# Patient Record
Sex: Male | Born: 1940 | Race: White | Hispanic: No | Marital: Married | Smoking: Never smoker
Health system: Southern US, Community
[De-identification: ages and names within clinical notes are randomized; demographics above are authoritative.]

## PROBLEM LIST (undated history)

## (undated) ENCOUNTER — Emergency Department: Admission: EM | Discharge status: Absent without leave | Payer: 59

## (undated) DIAGNOSIS — E079 Disorder of thyroid, unspecified: Secondary | ICD-10-CM

## (undated) DIAGNOSIS — G20A1 Parkinson's disease without dyskinesia, without mention of fluctuations: Secondary | ICD-10-CM

## (undated) DIAGNOSIS — I2510 Atherosclerotic heart disease of native coronary artery without angina pectoris: Secondary | ICD-10-CM

## (undated) DIAGNOSIS — R251 Tremor, unspecified: Secondary | ICD-10-CM

## (undated) DIAGNOSIS — E039 Hypothyroidism, unspecified: Secondary | ICD-10-CM

## (undated) DIAGNOSIS — G510 Bell's palsy: Secondary | ICD-10-CM

## (undated) DIAGNOSIS — I10 Essential (primary) hypertension: Secondary | ICD-10-CM

## (undated) DIAGNOSIS — E785 Hyperlipidemia, unspecified: Secondary | ICD-10-CM

## (undated) DIAGNOSIS — G20 Parkinson's disease: Secondary | ICD-10-CM

---

## 2000-07-17 HISTORY — PX: CAROTID STENT: SHX1301

## 2000-07-17 HISTORY — PX: CARDIAC CATHETERIZATION: SHX172

## 2014-08-12 ENCOUNTER — Observation Stay: Payer: Self-pay | Admitting: Surgery

## 2014-08-12 LAB — URINALYSIS, COMPLETE
Bacteria: NONE SEEN
Bilirubin,UR: NEGATIVE
Blood: NEGATIVE
Glucose,UR: 50 mg/dL (ref 0–75)
KETONE: NEGATIVE
LEUKOCYTE ESTERASE: NEGATIVE
Nitrite: NEGATIVE
PH: 7 (ref 4.5–8.0)
PROTEIN: NEGATIVE
Specific Gravity: 1.035 (ref 1.003–1.030)
Squamous Epithelial: NONE SEEN
WBC UR: 3 /HPF (ref 0–5)

## 2014-08-12 LAB — CBC WITH DIFFERENTIAL/PLATELET
BASOS ABS: 0.1 10*3/uL (ref 0.0–0.1)
BASOS PCT: 0.8 %
EOS ABS: 0 10*3/uL (ref 0.0–0.7)
EOS PCT: 0.4 %
HCT: 50.9 % (ref 40.0–52.0)
HGB: 16.7 g/dL (ref 13.0–18.0)
Lymphocyte #: 1.7 10*3/uL (ref 1.0–3.6)
Lymphocyte %: 20.6 %
MCH: 27.1 pg (ref 26.0–34.0)
MCHC: 32.7 g/dL (ref 32.0–36.0)
MCV: 83 fL (ref 80–100)
MONOS PCT: 10.1 %
Monocyte #: 0.8 x10 3/mm (ref 0.2–1.0)
NEUTROS ABS: 5.7 10*3/uL (ref 1.4–6.5)
Neutrophil %: 68.1 %
PLATELETS: 236 10*3/uL (ref 150–440)
RBC: 6.15 10*6/uL — AB (ref 4.40–5.90)
RDW: 13.5 % (ref 11.5–14.5)
WBC: 8.4 10*3/uL (ref 3.8–10.6)

## 2014-08-12 LAB — COMPREHENSIVE METABOLIC PANEL
ALBUMIN: 3.9 g/dL (ref 3.4–5.0)
ALK PHOS: 67 U/L (ref 46–116)
AST: 25 U/L (ref 15–37)
Anion Gap: 4 — ABNORMAL LOW (ref 7–16)
BUN: 16 mg/dL (ref 7–18)
Bilirubin,Total: 0.5 mg/dL (ref 0.2–1.0)
CALCIUM: 9.4 mg/dL (ref 8.5–10.1)
Chloride: 106 mmol/L (ref 98–107)
Co2: 32 mmol/L (ref 21–32)
Creatinine: 1.12 mg/dL (ref 0.60–1.30)
EGFR (African American): >60
EGFR (Non-African Amer.): >60
GLUCOSE: 121 mg/dL — AB (ref 65–99)
Osmolality: 286 (ref 275–301)
Potassium: 4.3 mmol/L (ref 3.5–5.1)
SGPT (ALT): 35 U/L (ref 14–63)
Sodium: 142 mmol/L (ref 136–145)
TOTAL PROTEIN: 7.1 g/dL (ref 6.4–8.2)

## 2014-08-12 LAB — LIPASE, BLOOD: LIPASE: 148 U/L (ref 73–393)

## 2014-08-26 ENCOUNTER — Ambulatory Visit: Payer: Self-pay | Admitting: Surgery

## 2014-09-03 ENCOUNTER — Ambulatory Visit: Payer: Self-pay | Admitting: Surgery

## 2014-09-03 HISTORY — PX: UMBILICAL HERNIA REPAIR: SHX196

## 2014-11-15 NOTE — H&P (Signed)
Subjective/Chief Complaint periumb pain and bulge   History of Present Illness Known [facility] for 2 years, pain started earlier today after eating pancakes no prior episode no f/c unable to reduce no n/v, no BM teday   Past History CAD, stents remote Hx HTN   Past Medical Health Coronary Artery Disease, Hypertension   Past Med/Surgical Hx:  Hypothyroidism:   High Cholesterol:   Hernia:   Stent Placement:   ALLERGIES:  No Known Allergies:   Family and Social History:  Family History Non-Contributory   Social History positive  tobacco, negative ETOH, min tobacco, min alcohol use   + Tobacco Current (within 1 year)   Place of Living Home   Review of Systems:  Fever/Chills No   Cough No   Abdominal Pain Yes   Diarrhea No   Constipation No   Nausea/Vomiting No   SOB/DOE No   Chest Pain No   Dysuria No   Medications/Allergies Reviewed Medications/Allergies reviewed   Physical Exam:  GEN no acute distress, comfortable   HEENT pink conjunctivae   RESP normal resp effort  clear BS  no use of accessory muscles   ABD positive hernia  [facility], incarcerated, nonreducible, tender without erythema   LYMPH negative neck   EXTR negative edema   SKIN normal to palpation   PSYCH alert, A+O to time, place, person, good insight   Lab Results: Hepatic:  27-Jan-16 12:59   Bilirubin, Total 0.5  Alkaline Phosphatase 67  SGPT (ALT) 35  SGOT (AST) 25  Total Protein, Serum 7.1  Albumin, Serum 3.9  Routine Chem:  27-Jan-16 12:59   Glucose, Serum  121  BUN 16  Creatinine (comp) 1.12  Sodium, Serum 142  Potassium, Serum 4.3  Chloride, Serum 106  CO2, Serum 32  Calcium (Total), Serum 9.4  Osmolality (calc) 286  eGFR (African American) >60  eGFR (Non-African American) >60 (eGFR values <49m/min/1.73 m2 may be an indication of chronic kidney disease (CKD). Calculated eGFR, using the MRDR Study equation, is useful in  patients with stable renal function. The eGFR  calculation will not be reliable in acutely ill patients when serum creatinine is changing rapidly. It is not useful in patients on dialysis. The eGFR calculation may not be applicable to patients at the low and high extremes of body sizes, pregnant women, and vegetarians.)  Anion Gap  4  Lipase 148 (Result(s) reported on 12 Aug 2014 at 01:31PM.)  Routine UA:  27-Jan-16 14:27   Color (UA) Straw  Clarity (UA) Clear  Glucose (UA) 50 mg/dL  Bilirubin (UA) Negative  Ketones (UA) Negative  Specific Gravity (UA) 1.035  Blood (UA) Negative  pH (UA) 7.0  Protein (UA) Negative  Nitrite (UA) Negative  Leukocyte Esterase (UA) Negative (Result(s) reported on 12 Aug 2014 at 02:54PM.)  RBC (UA) 1 /HPF  WBC (UA) 3 /HPF  Bacteria (UA) NONE SEEN  Epithelial Cells (UA) NONE SEEN  Result(s) reported on 12 Aug 2014 at 02:54PM.  Routine Hem:  27-Jan-16 12:59   WBC (CBC) 8.4  RBC (CBC)  6.15  Hemoglobin (CBC) 16.7  Hematocrit (CBC) 50.9  Platelet Count (CBC) 236  MCV 83  MCH 27.1  MCHC 32.7  RDW 13.5  Neutrophil % 68.1  Lymphocyte % 20.6  Monocyte % 10.1  Eosinophil % 0.4  Basophil % 0.8  Neutrophil # 5.7  Lymphocyte # 1.7  Monocyte # 0.8  Eosinophil # 0.0  Basophil # 0.1 (Result(s) reported on 12 Aug 2014 at 01:30PM.)  Radiology Results: CT:    27-Jan-16 14:03, CT Abdomen and Pelvis With Contrast  CT Abdomen and Pelvis With Contrast  REASON FOR EXAM:    (1) umbilical hernia-incarcerated IV contrast only;   (2) umbilical hernia-incarce  COMMENTS:       PROCEDURE: CT  - CT ABDOMEN / PELVIS  W  - Aug 12 2014  2:03PM     CLINICAL DATA:  Acute periumbilical abdominal pain.    EXAM:  CT ABDOMEN AND PELVIS WITH CONTRAST    TECHNIQUE:  Multidetector CT imaging of the abdomen and pelvis was performed  using the standard protocol following bolus administration of  intravenous contrast.  CONTRAST:  100 mL of Omnipaque 350 intravenously.    COMPARISON:   None.    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 Visualized  lung bases appear normal.    No gallstones are noted. The liver and spleen appear normal. 4.3 x  2.8 cm lobulated cystic area is seen posterior to the body of the  pancreas of unknown etiology. Adrenal glands appear normal.  Parapelvic cysts are seen involving the left kidney. No  hydronephrosis or renal obstruction is noted. No renal or ureteral  calculi are noted. Atherosclerotic calcificationsof abdominal aorta  and iliac arteries are noted without aneurysm formation. Small  periumbilical hernia is noted which contains a loop of small bowel,  and results in proximal small bowel obstruction. No abnormal fluid  collection is noted. Urinary bladder appears normal. Mild prostatic  hypertrophy is noted. No significant adenopathy is noted.     IMPRESSION:  Mild prostatic hypertrophy.    4.3 x 2.8 cm lobulated cystic abnormality seen posterior to the body  of the pancreas which may be arising from the pancreas. Followup CT  scan in 6-12 months is recommended to ensure stability.    Small periumbilical hernia is noted which contains a loop of  incarcerated small bowel, resulting in proximal small bowel  dilatation.  Electronically Signed    By: Sabino Dick M.D.    On: 08/12/2014 14:42         Verified By: Marveen Reeks, M.D.,    Assessment/Admission Diagnosis CT rev'd Inc Uh withg SB ptresent started this am needs repair tonight rec repair, mesh unlikely ioptions rationale rev'd risks of bl inf mesh placement mesh infection recurrence cosmetics, potential bowel resection anast leak add'l surgery rev'd with pt and family agrees with plan   Electronic Signatures: Florene Glen (MD)  (Signed 27-Jan-16 16:42)  Authored: CHIEF COMPLAINT and HISTORY, PAST MEDICAL/SURGIAL HISTORY, ALLERGIES, FAMILY AND SOCIAL HISTORY, REVIEW OF SYSTEMS, PHYSICAL EXAM, LABS, Radiology, ASSESSMENT AND  PLAN   Last Updated: 27-Jan-16 16:42 by Florene Glen (MD)

## 2014-11-15 NOTE — H&P (Signed)
PATIENT NAME:  Gregory [name], [name] MR#:  [id number] DATE OF BIRTH:  February 12, 1941  DATE OF ADMISSION:  08/12/2014  CHIEF COMPLAINT: Periumbilical pain.   HISTORY OF PRESENT ILLNESS: This is a patient who has a known umbilical hernia for approximately 2 years. He has always been able to push it back in, but this morning, at approximately 9:30 or 10:00, he ate pancakes and shortly thereafter, started having abdominal pain and was unable to reduce his hernia himself. He has had no nausea or vomiting. No fevers or chills, and has not had a bowel movement today. He denies back pain, but came to the Emergency Room for this unreducible umbilical hernia mass. I was called by the Emergency Room physician, who I spoke to personally, concerning his care.   PAST MEDICAL HISTORY: Coronary artery disease with stents. He denies stroke. He also has hypertension. His stent history is remote. He does not take an anticoagulant, other than aspirin.   PAST SURGICAL HISTORY: Stents only.   ALLERGIES: None.   MEDICATIONS: Multiple, see medication reconciliation.   FAMILY HISTORY: No history of significant illnesses, other than coronary artery disease.   SOCIAL HISTORY: The patient lives at home. He smokes tobacco on occasion, but not regularly and does not drink alcohol but rarely.   REVIEW OF SYSTEMS: A complete system review was performed and negative, with the exception I had mentioned in the HPI.   PHYSICAL EXAMINATION: GENERAL: Healthy-appearing male patient. He appears quite comfortable.  VITAL SIGNS: Stable. He is afebrile.  HEENT: No scleral icterus. No palpable neck nodes.  CHEST: Clear to auscultation.  CARDIAC: Regular rate and rhythm.   ABDOMEN: Soft, nondistended. There is an obvious incarcerated umbilical hernia, which is hard and tender to the touch, but there is no overlying erythema, no drainage, and no peritoneal signs.  EXTREMITIES: Without edema. Calves are nontender.  NEUROLOGIC: Grossly intact.   INTEGUMENT: No jaundice.   IMAGING STUDIES: CT scan is personally reviewed, showing an incarcerated umbilical hernia with bowel present. No fluid surrounding it.   LABORATORY DATA: White blood cell count is 8.4. H and H is 16.7 and 51, with a platelet count 236. Electrolytes are within normal limits. Glucose 121, lactic acid is 1.1. Urinalysis is clean.   ASSESSMENT AND PLAN: This is a patient with an incarcerated umbilical hernia, started earlier today. There is bowel present and he will require an emergency surgery this evening. The rationale for this has been discussed. The possibility of either me performing this or Dr. Michela PitcherEly, depending on scheduling, was discussed with he and his wife. The risks of bleeding, infection, possible bowel resection, mesh placement, mesh infection, recurrence, and additional surgery were all reviewed with he and his family. They understood and agreed to proceed.    ____________________________ Adah Salvageichard E. Excell Seltzerooper, MD rec:mw D: 08/12/2014 18:06:08 ET T: 08/12/2014 19:12:48 ET JOB#: [id number]  cc: Adah Salvageichard E. Excell Seltzerooper, MD, <Dictator> Lattie HawICHARD E COOPER MD ELECTRONICALLY SIGNED 08/13/2014 9:07

## 2014-11-15 NOTE — Op Note (Signed)
PATIENT NAME:  Gregory [name], Gregory [name] MR#:  [id number] DATE OF BIRTH:  06-03-1941  DATE OF PROCEDURE:  09/03/2014  PREOPERATIVE DIAGNOSIS:  Umbilical hernia.  POSTOPERATIVE DIAGNOSIS:  Umbilical hernia.    OPERATION: Robotic-assisted laparoscopic umbilical hernia repair.    ANESTHESIA:  General.  SURGEON:  Quentin Orealph L. Ely III, MD  PROCEDURE:  With the patient in the supine position after appropriate induction of general anesthesia, the abdomen was prepped with ChloraPrep and draped with sterile towels. The patient was placed in the flex position and rotated slightly to the right side. Left abdominal lateral incision was made and the abdomen cannulated using the Optiview cannula.  CO2 was insufflated.  Intra peritoneal position was confirmed and the hernia was identified. Robot ports with camera and 2 instrument ports were placed under direct vision. The robot was brought to the table, docked with the patient and instruments were inserted under direct vision.  I then went to the console. I saw only fat from the omentum in the defect. The fat was reduced. The defect was approximately 2 cm. The peritoneum was taken down and the sac was reduced into the abdominal cavity, creating a preperitoneal space. The defect was closed primarily with self-locking suture. A piece of Ventralex 4.3 cm in diameter was inserted through the assistant port and was placed in the preperitoneal space, sutured in place with 0 Ethibond suture.  The peritoneum was then tacked back over the mesh using the self-locking suture. The repair appeared to be satisfactory. Abdomen was desufflated and all ports were without difficulty.  Skin incisions were closed with 5-0 nylon.  The area was infiltrated with 0.25% Marcaine postop for pain control and sterile dressings were applied.  The patient returned to the recovery room, having tolerated the procedure well. Sponge, instrument and needle counts were correct x2 in the operating room.     ____________________________ Carmie Endalph L. Ely III, MD rle:mc D: 09/03/2014 11:48:25 ET T: 09/03/2014 12:13:43 ET JOB#: [id number]  cc: Carmie Endalph L. Ely III, MD, <Dictator> Jillene Bucksenny C. Arlana Pouchate, MD Quentin OreALPH L ELY MD ELECTRONICALLY SIGNED 09/04/2014 18:08

## 2014-11-15 NOTE — Discharge Summary (Signed)
PATIENT NAME:  Gregory [name], Gregory [name] MR#:  [id number] DATE OF BIRTH:  10-24-1940  DATE OF ADMISSION:  08/12/2014 DATE OF DISCHARGE:  08/12/2014  BRIEF HISTORY: [name] is a 74 year old gentleman admitted through the Emergency Room with an incarcerated umbilical hernia. He has had umbilical hernia for at least 3 years. He had no previous abdominal surgery. He developed an episode of incarceration this morning after breakfast. CT scan demonstrated a knuckle of bowel and a small umbilical hernia with obvious obstruction. He was admitted to the hospital but because of the recent meal, we elected to put him on for several hours before anesthesia reasons. He was set up for surgery this evening. He reduced spontaneously in the afternoon. After this discussion with the family and the patient, they would like to proceed with elective procedure as an outpatient. We will set him up for an office evaluation in the near future.   DISCHARGE MEDICATIONS: Include NyQuil 5 mL q. 6 hours p.[name].n., aspirin 81 mg in the evening, metoprolol 25 mg once a day, atorvastatin 40 mg once a day, and levothyroxine 75 mcg once a day.   FINAL DISCHARGE DIAGNOSIS: Incarcerated umbilical hernia with spontaneous reduction, no surgery.    ____________________________ Quentin Orealph L. Ely III, MD rle:bm D: 08/12/2014 20:09:26 ET T: 08/12/2014 23:33:32 ET JOB#: [id number]  cc: Carmie Endalph L. Ely III, MD, <Dictator> Jillene Bucksenny C. Arlana Pouchate, MD Quentin OreALPH L ELY MD ELECTRONICALLY SIGNED 08/13/2014 21:05

## 2015-06-16 ENCOUNTER — Emergency Department: Payer: Medicare Other

## 2015-06-16 ENCOUNTER — Inpatient Hospital Stay
Admission: EM | Admit: 2015-06-16 | Discharge: 2015-06-17 | DRG: 073 | Disposition: A | Discharge status: Home / Self Care | Payer: Medicare Other | Attending: Internal Medicine | Admitting: Internal Medicine

## 2015-06-16 ENCOUNTER — Encounter: Payer: Self-pay | Admitting: *Deleted

## 2015-06-16 DIAGNOSIS — R739 Hyperglycemia, unspecified: Secondary | ICD-10-CM | POA: Diagnosis present

## 2015-06-16 DIAGNOSIS — I2510 Atherosclerotic heart disease of native coronary artery without angina pectoris: Secondary | ICD-10-CM | POA: Diagnosis present

## 2015-06-16 DIAGNOSIS — I10 Essential (primary) hypertension: Secondary | ICD-10-CM | POA: Diagnosis present

## 2015-06-16 DIAGNOSIS — E785 Hyperlipidemia, unspecified: Secondary | ICD-10-CM | POA: Diagnosis present

## 2015-06-16 DIAGNOSIS — R29810 Facial weakness: Secondary | ICD-10-CM

## 2015-06-16 DIAGNOSIS — Z7982 Long term (current) use of aspirin: Secondary | ICD-10-CM

## 2015-06-16 DIAGNOSIS — Z955 Presence of coronary angioplasty implant and graft: Secondary | ICD-10-CM

## 2015-06-16 DIAGNOSIS — G510 Bell's palsy: Principal | ICD-10-CM | POA: Diagnosis present

## 2015-06-16 DIAGNOSIS — Z79899 Other long term (current) drug therapy: Secondary | ICD-10-CM | POA: Diagnosis not present

## 2015-06-16 DIAGNOSIS — I639 Cerebral infarction, unspecified: Secondary | ICD-10-CM | POA: Diagnosis present

## 2015-06-16 DIAGNOSIS — E039 Hypothyroidism, unspecified: Secondary | ICD-10-CM | POA: Diagnosis present

## 2015-06-16 HISTORY — DX: Essential (primary) hypertension: I10

## 2015-06-16 HISTORY — DX: Disorder of thyroid, unspecified: E07.9

## 2015-06-16 HISTORY — DX: Atherosclerotic heart disease of native coronary artery without angina pectoris: I25.10

## 2015-06-16 LAB — DIFFERENTIAL
BASOS ABS: 0.1 10*3/uL (ref 0–0.1)
BASOS PCT: 1 %
EOS ABS: 0.1 10*3/uL (ref 0–0.7)
EOS PCT: 2 %
LYMPHS ABS: 2.5 10*3/uL (ref 1.0–3.6)
Lymphocytes Relative: 29 %
Monocytes Absolute: 0.9 10*3/uL (ref 0.2–1.0)
Monocytes Relative: 11 %
NEUTROS PCT: 57 %
Neutro Abs: 5 10*3/uL (ref 1.4–6.5)

## 2015-06-16 LAB — CBC
HCT: 49.1 % (ref 40.0–52.0)
HEMOGLOBIN: 16.6 g/dL (ref 13.0–18.0)
MCH: 27.7 pg (ref 26.0–34.0)
MCHC: 33.7 g/dL (ref 32.0–36.0)
MCV: 82 fL (ref 80.0–100.0)
PLATELETS: 198 10*3/uL (ref 150–440)
RBC: 5.99 MIL/uL — ABNORMAL HIGH (ref 4.40–5.90)
RDW: 13.3 % (ref 11.5–14.5)
WBC: 8.6 10*3/uL (ref 3.8–10.6)

## 2015-06-16 LAB — PROTIME-INR
INR: 0.97
PROTHROMBIN TIME: 13.1 s (ref 11.4–15.0)

## 2015-06-16 LAB — COMPREHENSIVE METABOLIC PANEL
ALBUMIN: 3.9 g/dL (ref 3.5–5.0)
ALT: 37 U/L (ref 17–63)
ANION GAP: 8 (ref 5–15)
AST: 36 U/L (ref 15–41)
Alkaline Phosphatase: 51 U/L (ref 38–126)
BUN: 15 mg/dL (ref 6–20)
CHLORIDE: 108 mmol/L (ref 101–111)
CO2: 26 mmol/L (ref 22–32)
Calcium: 9.4 mg/dL (ref 8.9–10.3)
Creatinine, Ser: 1.11 mg/dL (ref 0.61–1.24)
GFR calc Af Amer: >60 mL/min (ref >60)
GFR calc non Af Amer: >60 mL/min (ref >60)
GLUCOSE: 173 mg/dL — AB (ref 65–99)
POTASSIUM: 3.9 mmol/L (ref 3.5–5.1)
SODIUM: 142 mmol/L (ref 135–145)
TOTAL PROTEIN: 6.7 g/dL (ref 6.5–8.1)
Total Bilirubin: 0.6 mg/dL (ref 0.3–1.2)

## 2015-06-16 LAB — GLUCOSE, CAPILLARY: Glucose-Capillary: 174 mg/dL — ABNORMAL HIGH (ref 65–99)

## 2015-06-16 LAB — APTT: APTT: 27 s (ref 24–36)

## 2015-06-16 MED ORDER — ATORVASTATIN CALCIUM 20 MG PO TABS
40.0000 mg | ORAL_TABLET | Freq: Every day | ORAL | Status: DC
  Administered 2015-06-16: 40 mg via ORAL
  Filled 2015-06-16: qty 2

## 2015-06-16 MED ORDER — HYDRALAZINE HCL 20 MG/ML IJ SOLN: 10.0000 mg | Freq: Four times a day (QID) | INTRAMUSCULAR | Status: DC | PRN

## 2015-06-16 MED ORDER — ENOXAPARIN SODIUM 40 MG/0.4ML ~~LOC~~ SOLN
40.0000 mg | SUBCUTANEOUS | Status: DC
  Administered 2015-06-16: 40 mg via SUBCUTANEOUS
  Filled 2015-06-16: qty 0.4

## 2015-06-16 MED ORDER — LEVOTHYROXINE SODIUM 88 MCG PO TABS
88.0000 ug | ORAL_TABLET | Freq: Every day | ORAL | Status: DC
  Administered 2015-06-17: 88 ug via ORAL
  Filled 2015-06-16: qty 1

## 2015-06-16 MED ORDER — ASPIRIN EC 325 MG PO TBEC
325.0000 mg | DELAYED_RELEASE_TABLET | Freq: Every day | ORAL | Status: DC
  Administered 2015-06-16 – 2015-06-17 (×2): 325 mg via ORAL
  Filled 2015-06-16 (×2): qty 1

## 2015-06-16 MED ORDER — ACETAMINOPHEN 325 MG PO TABS: 650.0000 mg | ORAL_TABLET | Freq: Four times a day (QID) | ORAL | Status: DC | PRN

## 2015-06-16 MED ORDER — ACYCLOVIR 200 MG PO CAPS
800.0000 mg | ORAL_CAPSULE | Freq: Once | ORAL | Status: AC
  Administered 2015-06-16: 800 mg via ORAL
  Filled 2015-06-16: qty 4

## 2015-06-16 MED ORDER — ACETAMINOPHEN 650 MG RE SUPP: 650.0000 mg | Freq: Four times a day (QID) | RECTAL | Status: DC | PRN

## 2015-06-16 MED ORDER — ONDANSETRON HCL 4 MG PO TABS: 4.0000 mg | ORAL_TABLET | Freq: Four times a day (QID) | ORAL | Status: DC | PRN

## 2015-06-16 MED ORDER — VALACYCLOVIR HCL 500 MG PO TABS
1000.0000 mg | ORAL_TABLET | Freq: Three times a day (TID) | ORAL | Status: DC
Start: 2015-06-16 — End: 2015-06-17
  Administered 2015-06-16 – 2015-06-17 (×2): 1000 mg via ORAL
  Filled 2015-06-16 (×4): qty 2

## 2015-06-16 MED ORDER — ASPIRIN 81 MG PO CHEW: 324.0000 mg | CHEWABLE_TABLET | Freq: Once | ORAL | Status: AC

## 2015-06-16 MED ORDER — METOPROLOL SUCCINATE ER 25 MG PO TB24
25.0000 mg | ORAL_TABLET | Freq: Every day | ORAL | Status: DC
  Administered 2015-06-17: 11:00:00 25 mg via ORAL
  Filled 2015-06-16: qty 1

## 2015-06-16 MED ORDER — SODIUM CHLORIDE 0.9 % IJ SOLN
3.0000 mL | Freq: Two times a day (BID) | INTRAMUSCULAR | Status: DC
  Administered 2015-06-16 – 2015-06-17 (×2): 3 mL via INTRAVENOUS

## 2015-06-16 MED ORDER — PREDNISONE 20 MG PO TABS
60.0000 mg | ORAL_TABLET | Freq: Once | ORAL | Status: AC
  Administered 2015-06-16: 60 mg via ORAL
  Filled 2015-06-16: qty 3

## 2015-06-16 MED ORDER — ONDANSETRON HCL 4 MG/2ML IJ SOLN: 4.0000 mg | Freq: Four times a day (QID) | INTRAMUSCULAR | Status: DC | PRN

## 2015-06-16 MED ORDER — ASPIRIN 81 MG PO CHEW
324.0000 mg | CHEWABLE_TABLET | Freq: Once | ORAL | Status: AC
  Administered 2015-06-16: 324 mg via ORAL
  Filled 2015-06-16: qty 4

## 2015-06-16 NOTE — ED Notes (Signed)
Pt rolling out of ED, Gafferarah RN on 1 C notified of patients ETA and she was also notified that Dr Elpidio AnisSudini is aware of patients vitals.

## 2015-06-16 NOTE — ED Notes (Signed)
telestroke md on video for exam

## 2015-06-16 NOTE — Progress Notes (Signed)
   06/16/15 1530  Clinical Encounter Type  Visited With Patient and family together  Visit Type Initial  Referral From Nurse  Consult/Referral To Chaplain  Code Stroke Page...turned out to be Bell's Palsy. Offered comforting presence to patient and family. Chaplain Cordelia PocheEmily Sereniti Wan

## 2015-06-16 NOTE — ED Provider Notes (Signed)
[facility] Emergency Department Provider Note  ____________________________________________  Time seen: Approximately 15:38 PM  I have reviewed the triage vital signs and the nursing notes.   HISTORY  Chief Complaint Code Stroke    HPI Gregory [name] is [name] 74 y.o. male with history of coronary artery disease and hypertension who presents for evaluation of sudden onset right facial droop and numbness which began at approximately 2:45 PM, sudden onset, constant since onset, currently moderate, no modifying factors. He reports that last night he began developing watering/drainage of the right eye and had some right ear and facial pain since that time. Throughout the day today he developed some weakness in the right face since that he was drooling when he was trying to drink fluids. No chest pain or difficulty breathing. No numbness or weakness elsewhere, no speech/word finding difficulty. Prior to today, he had been in his usual state of health.   Past Medical History  Diagnosis Date  . Coronary artery disease     There are no active problems to display for this patient.   No past surgical history on file.  Current Outpatient Rx  Name  Route  Sig  Dispense  Refill  . atorvastatin (LIPITOR) 40 MG tablet   Oral   Take 40 mg by mouth at bedtime.         Marland Kitchen levothyroxine (SYNTHROID, LEVOTHROID) 88 MCG tablet   Oral   Take 88 mcg by mouth daily.         . metoprolol succinate (TOPROL-XL) 50 MG 24 hr tablet   Oral   Take 25 mg by mouth daily.           Allergies Review of patient's allergies indicates no known allergies.  No family history on file.  Social History Social History  Substance Use Topics  . Smoking status: None  . Smokeless tobacco: None  . Alcohol Use: None    Review of Systems Constitutional: No fever/chills Eyes: No visual changes. ENT: No sore throat. Cardiovascular: Denies chest pain. Respiratory: Denies shortness of  breath. Gastrointestinal: No abdominal pain.  No nausea, no vomiting.  No diarrhea.  No constipation. Genitourinary: Negative for dysuria. Musculoskeletal: Negative for back pain. Skin: Negative for rash. Neurological: Negative for headaches, + right facial weakness  10-point ROS otherwise negative.  ____________________________________________   PHYSICAL EXAM:  VITAL SIGNS: ED Triage Vitals  Enc Vitals Group     BP 06/16/15 1538 185/87 mmHg     Pulse Rate 06/16/15 1538 84     Resp 06/16/15 1538 20     Temp 06/16/15 1538 98 F (36.7 C)     Temp Source 06/16/15 1538 Oral     SpO2 06/16/15 1538 95 %     Weight 06/16/15 1538 170 lb (77.111 kg)     Height 06/16/15 1538  (1.702 m)     Head Cir --      Peak Flow --      Pain Score --      Pain Loc --      Pain Edu? --      Excl. in GC? --     Constitutional: Alert and oriented. Well appearing and in no acute distress. Eyes: Conjunctivae are normal. PERRL. EOMI. Head: Atraumatic. Nose: No congestion/rhinnorhea. Mouth/Throat: Mucous membranes are moist.  Oropharynx non-erythematous. Neck: No stridor.   Cardiovascular: Normal rate, regular rhythm. Grossly normal heart sounds.  Good peripheral circulation. Respiratory: Normal respiratory effort.  No retractions. Lungs CTAB.  Gastrointestinal: Soft and nontender. No distention.No CVA tenderness. Genitourinary: deferred Musculoskeletal: No lower extremity tenderness nor edema.  No joint effusions. Neurologic:  Normal speech and language. 5 out of 5 strength in bilateral upper and lower extremities. Sensation intact to light touch throughout. Right facial droop is noted with involvement of the right forehead which is smooth, the patient is unable to raise the right eyebrow/wrinkle the right forehead. Remainder of his cranial nerves are intact with the exception of VII, normal finger-nose-finger. Skin:  Skin is warm, dry and intact. No rash noted. Psychiatric: Mood and affect are  normal. Speech and behavior are normal.  ____________________________________________   LABS (all labs ordered are listed, but only abnormal results are displayed)  Labs Reviewed  CBC - Abnormal; Notable for the following:    RBC 5.99 (*)    All other components within normal limits  COMPREHENSIVE METABOLIC PANEL - Abnormal; Notable for the following:    Glucose, Bld 173 (*)    All other components within normal limits  GLUCOSE, CAPILLARY - Abnormal; Notable for the following:    Glucose-Capillary 174 (*)    All other components within normal limits  PROTIME-INR  APTT  DIFFERENTIAL  CBG MONITORING, ED   ____________________________________________  EKG  ED ECG REPORT I, Gregory [name], Gregory [name], the attending physician, personally viewed and interpreted this ECG.   Date: 06/16/2015  EKG Time: 15:54  Rate: 72  Rhythm: normal sinus rhythm  Axis: normal  Intervals:none  ST&T Change: No acute ST elevation.  ____________________________________________  RADIOLOGY  CT head IMPRESSION: Suspect acute or subacute lacunar infarct involving left lentiform nucleus and deep periventricular white matter. Consider brain MRI for further evaluation.  No evidence intracranial hemorrhage or mass effect.  ____________________________________________   PROCEDURES  Procedure(s) performed: None  Critical Care performed: Yes, see critical care note(s). Total critical care time spent 30 minutes.  ____________________________________________   INITIAL IMPRESSION / ASSESSMENT AND PLAN / ED COURSE  Pertinent labs & imaging results that were available during my care of the patient were reviewed by me and considered in my medical decision making (see chart for details).  Gregory [name] is [name] 74 y.o. male with history of coronary artery disease and hypertension who presents for evaluation of sudden onset right facial droop and numbness which began at approximately 2:45 PM. On exam, he is  generally well-appearing and in no acute distress. Vital signs stable, he is afebrile. He is unable he to completely close the right eye, the right eye is draining clear tears, he has involvement of the right forehead as well as right-sided facial droop which would be consistent with Bell's palsy as he has no other neurological deficits. NIH stroke scale is 2 on arrival, given mild deficits, likely Bell's palsy, will not give TPA. Code stroke was initiated on patient's arrival[facility] and recommended against TPA. He recommends treatment with steroids as well as all cycle here for likely Bell's palsy however given CT findings which are read by radiologist as acute versus subacute left lacunar infarct involving the lentiform nucleus and deep periventricular white matter, Dr. Horton MarshallBarbash recommends MRI for stroke workup.Dr. Horton MarshallBarbash believes infarct to be old and though clinically the patient's picture appears much more consistent with Bell's palsy, he reports there is [name] slight chance that this could represent acute CVA. We'll discuss with hospitalist for admission.   ____________________________________________   FINAL CLINICAL IMPRESSION(S) / ED DIAGNOSES  Final diagnoses:  Bell's palsy  Cerebrovascular accident (  CVA), unspecified mechanism (HCC)      Gregory Doss, MD 06/17/15 [phone]

## 2015-06-16 NOTE — H&P (Signed)
[facility] [place] [facility]   PATIENT NAME: Gregory [name]    MR#:  [id number]  DATE OF BIRTH:  [birth year]  DATE OF ADMISSION:  06/16/2015  PRIMARY CARE PHYSICIAN: No primary care provider on file.   REQUESTING/REFERRING PHYSICIAN:   CHIEF COMPLAINT:   Chief Complaint  Patient presents with  . Code Stroke    HISTORY OF PRESENT ILLNESS: Gregory Barbaralbert Dotter  is a 74 y.o. male with a known history of coronary artery disease status post stent in 2002. Hypertension, hyperlipidemia, hypothyroidism who presents to the hospital with complaints of right-sided facial weakness and left lip numbness. According to patient, he was drinking water and noted that he having difficulty drinking since her of water was running out from his lips. He started noticing that his right-sided eye would not close completely on Monday, 2 days ago, today, however, he noted to have left lip numbness, he denied any left or right upper or lower extremity weakness, but on arrival to emergency room, head CT revealed left lentiform nucleus and deep periventricular white matter acute or subacute lacunar infarct. Patient is also complaining of right ear pain . He was seen by neurologist. In emergency room and felt to have Bell's palsy as well. Hospitalist services were contacted for admission and workup of patient with stroke and Bell's palsy. Acyclovir was initiated.   PAST MEDICAL HISTORY:   Past Medical History  Diagnosis Date  . Coronary artery disease     PAST SURGICAL HISTORY: Umbilical hernia repair.  SOCIAL HISTORY:  Social History  Substance Use Topics  . Smoking status: Not on file  . Smokeless tobacco: Not on file  . Alcohol Use: Not on file    FAMILY HISTORY: No early coronary artery disease  DRUG ALLERGIES: No Known Allergies  Review of Systems  Constitutional: Negative for fever, chills, weight loss and malaise/fatigue.  HENT: Negative for congestion.   Eyes: Positive for  blurred vision and pain. Negative for double vision.  Respiratory: Negative for cough, sputum production, shortness of breath and wheezing.   Cardiovascular: Negative for chest pain, palpitations, orthopnea, leg swelling and PND.  Gastrointestinal: Negative for nausea, vomiting, abdominal pain, diarrhea, constipation, blood in stool and melena.  Genitourinary: Negative for dysuria, urgency, frequency and hematuria.  Musculoskeletal: Negative for falls.  Skin: Negative for rash.  Neurological: Positive for sensory change, speech change and focal weakness. Negative for dizziness and weakness.  Psychiatric/Behavioral: Negative for depression and memory loss. The patient is not nervous/anxious.     MEDICATIONS AT HOME:  Prior to Admission medications   Medication Sig Start Date End Date Taking? Authorizing Provider  acetaminophen (TYLENOL) 650 MG CR tablet Take 650 mg by mouth every 8 (eight) hours as needed for pain.   Yes Historical Provider, MD  aspirin EC 81 MG tablet Take 81 mg by mouth daily.   Yes Historical Provider, MD  atorvastatin (LIPITOR) 40 MG tablet Take 40 mg by mouth at bedtime.   Yes Historical Provider, MD  levothyroxine (SYNTHROID, LEVOTHROID) 88 MCG tablet Take 88 mcg by mouth daily.   Yes Historical Provider, MD  metoprolol succinate (TOPROL-XL) 50 MG 24 hr tablet Take 25 mg by mouth daily.   Yes Historical Provider, MD      PHYSICAL EXAMINATION:   VITAL SIGNS: Blood pressure 186/101, pulse 58, temperature 98 F (36.7 C), temperature source Oral, resp. rate 19, height 5\' 7"  (1.702 m), weight 77.111 kg (170 lb), SpO2 95 %.  GENERAL:  74  y.o.-year-old patient lying in the bed with no acute distress. Artery uncomfortable due to right-sided facial weakness and right ear pain ,  EYES: Pupils equal, round, reactive to light and accommodation. No scleral icterus. Extraocular muscles intact.  HEENT: Head atraumatic, normocephalic. Oropharynx and nasopharynx clear.  NECK:   Supple, no jugular venous distention. No thyroid enlargement, no tenderness.  LUNGS: Normal breath sounds bilaterally, no wheezing, rales,rhonchi or crepitation. No use of accessory muscles of respiration.  CARDIOVASCULAR: S1, S2 normal. No murmurs, rubs, or gallops.  ABDOMEN: Soft, nontender, nondistended. Bowel sounds present. No organomegaly or mass.  EXTREMITIES: No pedal edema, cyanosis, or clubbing.  NEUROLOGIC: Cranial nervesrevealed right facial weakness including forehead, right orbital muscle. Muscle strength 5/5 in all extremities. Sensation intact. Gait not checked.  PSYCHIATRIC: The patient is alert and oriented x 3.  SKIN: No obvious rash, lesion, or ulcer.   LABORATORY PANEL:   CBC  Recent Labs Lab 06/16/15 1546  WBC 8.6  HGB 16.6  HCT 49.1  PLT 198  MCV 82.0  MCH 27.7  MCHC 33.7  RDW 13.3  LYMPHSABS 2.5  MONOABS 0.9  EOSABS 0.1  BASOSABS 0.1   ------------------------------------------------------------------------------------------------------------------  Chemistries   Recent Labs Lab 06/16/15 1546  NA 142  K 3.9  CL 108  CO2 26  GLUCOSE 173*  BUN 15  CREATININE 1.11  CALCIUM 9.4  AST 36  ALT 37  ALKPHOS 51  BILITOT 0.6   ------------------------------------------------------------------------------------------------------------------  Cardiac Enzymes No results for input(s): TROPONINI in the last 168 hours. ------------------------------------------------------------------------------------------------------------------  RADIOLOGY: Ct Head Wo Contrast  06/16/2015  CLINICAL DATA:  Acute onset right facial droop and slurred speech 1 hour ago. Code stroke. EXAM: CT HEAD WITHOUT CONTRAST TECHNIQUE: Contiguous axial images were obtained from the base of the skull through the vertex without intravenous contrast. COMPARISON:  None. FINDINGS: Brain: Ill-defined area of decreased attenuation is seen involving the left lentiform nucleus and deep  periventricular white matter, suspicious for acute or subacute lacunar infarct. No evidence of hemorrhage, extra-axial collection, ventriculomegaly, or mass effect. Vascular: No hyperdense vessel or unexpected calcification. Skull: Negative for fracture or focal lesion. Sinuses/Orbits: No acute findings. Other: None. IMPRESSION: Suspect acute or subacute lacunar infarct involving left lentiform nucleus and deep periventricular white matter. Consider brain MRI for further evaluation. No evidence intracranial hemorrhage or mass effect. These results were called by telephone at the time of interpretation on 06/16/2015 at 3:52 pm to Dr. Sharman Cheek , who verbally acknowledged these results. Electronically Signed   By: Myles Rosenthal M.D.   On: 06/16/2015 15:53    EKG: Orders placed or performed during the hospital encounter of 06/16/15  . ED EKG  . ED EKG  . EKG 12-Lead  . EKG 12-Lead    IMPRESSION AND PLAN:  Active Problems:   CVA (cerebral infarction) 1. Stroke, left lentiform nucleus, admitted to inpatient medical floor, continue aspirin therapy, get Doppler ultrasound of carotid arteries, as well as echocardiogram, continue Lipitor, check lipid panel 2. Right-sided Bell's palsy, initiate patient on valacyclovir, AST, speech therapist evaluation 3. Malignant essential hypertension, continue metoprolol for now, no further intervention unless systolic blood pressures above 200 4. Hyperglycemia getting a hemoglobin A1c   All the records are reviewed and case discussed with ED provider. Management plans discussed with the patient, family and they are in agreement.  CODE STATUS:    TOTAL TIME TAKING CARE OF THIS PATIENT: 60  minutes.    Katharina Caper M.D on 06/16/2015 at 7:22 PM  Between 7am to 6pm - Pager - [phone] After 6pm go to [email or web address] - password [email or web address] [facility]  [place] [facility]  Office  [phone]  CC: Primary care physician; No primary care provider on  file.

## 2015-06-16 NOTE — ED Notes (Signed)
1943-  Report called to Sarah on 1 C.  pts BP 178/104 so she requested that the ED notify MD  1950 -  Dr Elpidio AnisSudini was notified of pts BP, no additional orders prior to admission.   [email or web address]- ED tech notified to transport patient.

## 2015-06-16 NOTE — ED Notes (Signed)
Pt reports right facial weakness, pt has slurred speech with a right facial droop, Dr Scotty CourtStafford examined pt in triage

## 2015-06-17 ENCOUNTER — Inpatient Hospital Stay
Admit: 2015-06-17 | Discharge: 2015-06-17 | Disposition: A | Discharge status: Home / Self Care | Payer: Medicare Other | Attending: Internal Medicine | Admitting: Internal Medicine

## 2015-06-17 ENCOUNTER — Inpatient Hospital Stay: Payer: Medicare Other

## 2015-06-17 LAB — CBC
HEMATOCRIT: 51.1 % (ref 40.0–52.0)
HEMOGLOBIN: 17.2 g/dL (ref 13.0–18.0)
MCH: 27.8 pg (ref 26.0–34.0)
MCHC: 33.7 g/dL (ref 32.0–36.0)
MCV: 82.5 fL (ref 80.0–100.0)
PLATELETS: 203 10*3/uL (ref 150–440)
RBC: 6.2 MIL/uL — AB (ref 4.40–5.90)
RDW: 13.5 % (ref 11.5–14.5)
WBC: 12 10*3/uL — ABNORMAL HIGH (ref 3.8–10.6)

## 2015-06-17 LAB — LIPID PANEL
CHOL/HDL RATIO: 3.6 ratio
CHOLESTEROL: 159 mg/dL (ref 0–200)
HDL: 44 mg/dL (ref >40)
LDL Cholesterol: 97 mg/dL (ref 0–99)
TRIGLYCERIDES: 88 mg/dL (ref <150)
VLDL: 18 mg/dL (ref 0–40)

## 2015-06-17 LAB — BASIC METABOLIC PANEL
ANION GAP: 7 (ref 5–15)
BUN: 18 mg/dL (ref 6–20)
CALCIUM: 9.2 mg/dL (ref 8.9–10.3)
CO2: 26 mmol/L (ref 22–32)
Chloride: 105 mmol/L (ref 101–111)
Creatinine, Ser: 0.96 mg/dL (ref 0.61–1.24)
GFR calc non Af Amer: >60 mL/min (ref >60)
Glucose, Bld: 153 mg/dL — ABNORMAL HIGH (ref 65–99)
POTASSIUM: 4.1 mmol/L (ref 3.5–5.1)
Sodium: 138 mmol/L (ref 135–145)

## 2015-06-17 LAB — HEMOGLOBIN A1C: Hgb A1c MFr Bld: 6 % (ref 4.0–6.0)

## 2015-06-17 MED ORDER — PREDNISONE 20 MG PO TABS: 60.0000 mg | ORAL_TABLET | Freq: Every day | ORAL | Status: DC

## 2015-06-17 MED ORDER — VALACYCLOVIR HCL 1 G PO TABS: 1000.0000 mg | ORAL_TABLET | Freq: Three times a day (TID) | ORAL | Status: AC

## 2015-06-17 MED ORDER — PREDNISONE 20 MG PO TABS
60.0000 mg | ORAL_TABLET | Freq: Every day | ORAL | Status: DC
  Administered 2015-06-17: 13:00:00 60 mg via ORAL
  Filled 2015-06-17: qty 3

## 2015-06-17 NOTE — Progress Notes (Signed)
*  PRELIMINARY RESULTS* Echocardiogram 2D Echocardiogram has been performed.  Gregory HousekeeperJerry R [name] 06/17/2015, 8:52 AM

## 2015-06-17 NOTE — Plan of Care (Signed)
Problem: Tissue Perfusion: Goal: Cerebral tissue perfusion will improve Outcome: Progressing Plan of care, progress of goals. 1.Pt admitted tonight secondary to possible CVA. 2. NIH score 1 secondary to right facial droop. 3. VSS, pt comfortable through the night. 4. Stroke education pamphlet provided.  Henriette CombsSarah Kaneshia Cater RN

## 2015-06-17 NOTE — Discharge Summary (Signed)
[facility] - [place] at [facility]   PATIENT NAME: Gregory [name]    MR#:  [id number]  DATE OF BIRTH:  04-19-1941  DATE OF ADMISSION:  06/16/2015 ADMITTING PHYSICIAN: Katharina Caper, MD  DATE OF DISCHARGE: 112/1/16  PRIMARY CARE PHYSICIAN: Dr Arlana Pouch   ADMISSION DIAGNOSIS:  Bell's palsy [G51.0] Facial droop [R29.810] CVA (cerebral infarction) [I63.9] Cerebrovascular accident (CVA), unspecified mechanism (HCC) [I63.9]  DISCHARGE DIAGNOSIS:  Right side Bell's palsy  SECONDARY DIAGNOSIS:   Past Medical History  Diagnosis Date  . Coronary artery disease   . Thyroid disease   . Hypertension     HOSPITAL COURSE:  Gregory [name] is a 74 y.o. male with a known history of coronary artery disease status post stent in 2002. Hypertension, hyperlipidemia, hypothyroidism who presents to the hospital with complaints of right-sided facial weakness and left lip numbness. According to patient, he was drinking water and noted that he having difficulty drinking since her of water was running out from his lips. He started noticing that his right-sided eye would not close completely on Monday, 2 days ago, today  1. Right-sided Bell's palsy -on  Valacyclovir and po prednisone (60 mg qd x6 days) -according to pharamcist no need to taper steroids (looked up trials) -MRI brain neg for CVA  2. Malignant essential hypertension, continue metoprolol  -improved  3.hypothroidsim cont synthroid  4.elevated BS pt advised to f/u with PCP A1c is 6.0%  D/c home. Pt agreeable to it   CONSULTS OBTAINED:   none  DRUG ALLERGIES:  No Known Allergies  DISCHARGE MEDICATIONS:   Current Discharge Medication List    START taking these medications   Details  predniSONE (DELTASONE) 20 MG tablet Take 3 tablets (60 mg total) by mouth daily with breakfast. For 6 days Qty: 18 tablet, Refills: 0    valACYclovir (VALTREX) 1000 MG tablet Take 1 tablet (1,000 mg total) by mouth 3  (three) times daily. Qty: 18 tablet, Refills: 0      CONTINUE these medications which have NOT CHANGED   Details  acetaminophen (TYLENOL) 650 MG CR tablet Take 650 mg by mouth every 8 (eight) hours as needed for pain.    aspirin EC 81 MG tablet Take 81 mg by mouth daily.    atorvastatin (LIPITOR) 40 MG tablet Take 40 mg by mouth at bedtime.    levothyroxine (SYNTHROID, LEVOTHROID) 88 MCG tablet Take 88 mcg by mouth daily.    metoprolol succinate (TOPROL-XL) 50 MG 24 hr tablet Take 25 mg by mouth daily.        If you experience worsening of your admission symptoms, develop shortness of breath, life threatening emergency, suicidal or homicidal thoughts you must seek medical attention immediately by calling 911 or calling your MD immediately  if symptoms less severe.  You Must read complete instructions/literature along with all the possible adverse reactions/side effects for all the Medicines you take and that have been prescribed to you. Take any new Medicines after you have completely understood and accept all the possible adverse reactions/side effects.   Please note  You were cared for by a hospitalist during your hospital stay. If you have any questions about your discharge medications or the care you received while you were in the hospital after you are discharged, you can call the unit and asked to speak with the hospitalist on call if the hospitalist that took care of you is not available. Once you are discharged, your primary care physician will handle any further  medical issues. Please note that NO REFILLS for any discharge medications will be authorized once you are discharged, as it is imperative that you return to your primary care physician (or establish a relationship with a primary care physician if you do not have one) for your aftercare needs so that they can reassess your need for medications and monitor your lab values. Today   SUBJECTIVE   Doing well  VITAL SIGNS:   Blood pressure 166/98, pulse 64, temperature 97.5 F (36.4 C), temperature source Oral, resp. rate 58, height  (1.702 m), weight 79.379 kg (175 lb), SpO2 98 %.  I/O:  No intake or output data in the 24 hours ending 06/17/15 1201  PHYSICAL EXAMINATION:  GENERAL:  74 y.o.-year-old patient lying in the bed with no acute distress.  EYES: Pupils equal, round, reactive to light and accommodation. No scleral icterus. Extraocular muscles intact.  HEENT: Head atraumatic, normocephalic. Oropharynx and nasopharynx clear.  NECK:  Supple, no jugular venous distention. No thyroid enlargement, no tenderness.  LUNGS: Normal breath sounds bilaterally, no wheezing, rales,rhonchi or crepitation. No use of accessory muscles of respiration.  CARDIOVASCULAR: S1, S2 normal. No murmurs, rubs, or gallops.  ABDOMEN: Soft, non-tender, non-distended. Bowel sounds present. No organomegaly or mass.  EXTREMITIES: No pedal edema, cyanosis, or clubbing.  NEUROLOGIC:  Muscle strength 5/5 in all extremities. Sensation intact. Gait not checked. Right facial palsy PSYCHIATRIC: The patient is alert and oriented x 3.  SKIN: No obvious rash, lesion, or ulcer.   DATA REVIEW:   CBC   Recent Labs Lab 06/17/15 0447  WBC 12.0*  HGB 17.2  HCT 51.1  PLT 203    Chemistries   Recent Labs Lab 06/16/15 1546 06/17/15 0447  NA 142 138  K 3.9 4.1  CL 108 105  CO2 26 26  GLUCOSE 173* 153*  BUN 15 18  CREATININE 1.11 0.96  CALCIUM 9.4 9.2  AST 36  --   ALT 37  --   ALKPHOS 51  --   BILITOT 0.6  --     Microbiology Results   No results found for this or any previous visit (from the past 240 hour(s)).  RADIOLOGY:  Ct Head Wo Contrast  06/16/2015  CLINICAL DATA:  Acute onset right facial droop and slurred speech 1 hour ago. Code stroke. EXAM: CT HEAD WITHOUT CONTRAST TECHNIQUE: Contiguous axial images were obtained from the base of the skull through the vertex without intravenous contrast. COMPARISON:  None.  FINDINGS: Brain: Ill-defined area of decreased attenuation is seen involving the left lentiform nucleus and deep periventricular white matter, suspicious for acute or subacute lacunar infarct. No evidence of hemorrhage, extra-axial collection, ventriculomegaly, or mass effect. Vascular: No hyperdense vessel or unexpected calcification. Skull: Negative for fracture or focal lesion. Sinuses/Orbits: No acute findings. Other: None. IMPRESSION: Suspect acute or subacute lacunar infarct involving left lentiform nucleus and deep periventricular white matter. Consider brain MRI for further evaluation. No evidence intracranial hemorrhage or mass effect. These results were called by telephone at the time of interpretation on 06/16/2015 at 3:52 pm to Dr. Sharman Cheek , who verbally acknowledged these results. Electronically Signed   By: Myles Rosenthal M.D.   On: 06/16/2015 15:53   Mr Brain Wo Contrast  06/17/2015  CLINICAL DATA:  Right-sided facial weakness and left lip numbness. EXAM: MRI HEAD WITHOUT CONTRAST TECHNIQUE: Multiplanar, multiecho pulse sequences of the brain and surrounding structures were obtained without intravenous contrast. COMPARISON:  Head CT from yesterday FINDINGS: Calvarium and  upper cervical spine: Moderate ligamentous thickening behind the dens without cervicomedullary compression. No focal marrow lesion. Orbits: Staphyloma of both globes. Sinuses and Mastoids: Mild scattered mucosal edema.  No fluid level. Brain: No acute infarct, hemorrhage, hydrocephalus, or mass lesion. No evidence of large vessel occlusion. The findings on previous head CT could represent dilated perivascular spaces, accentuated in the setting of hypertension and advancing age. Age congruent chronic small vessel ischemic gliosis in the deep cerebral white matter. No explanatory finding in the temporal bone or along the facial nerve course to explain deficits. IMPRESSION: No acute finding or explanation for deficits.  Unremarkable brain MRI for age. Electronically Signed   By: Marnee SpringJonathon  Watts M.D.   On: 06/17/2015 09:53   [place] Carotid Bilateral  06/17/2015  CLINICAL DATA:  Stroke EXAM: BILATERAL CAROTID DUPLEX ULTRASOUND TECHNIQUE: Wallace CullensGray scale imaging, color Doppler and duplex ultrasound were performed of bilateral carotid and vertebral arteries in the neck. COMPARISON:  None. FINDINGS: Criteria: Quantification of carotid stenosis is based on velocity parameters that correlate the residual internal carotid diameter with NASCET-based stenosis levels, using the diameter of the distal internal carotid lumen as the denominator for stenosis measurement. The following velocity measurements were obtained: RIGHT ICA:  83 cm/sec CCA:  100 cm/sec SYSTOLIC ICA/CCA RATIO:  0.8 DIASTOLIC ICA/CCA RATIO:  0.7 ECA:  144 cm/sec LEFT ICA:  72 cm/sec CCA:  84 cm/sec SYSTOLIC ICA/CCA RATIO:  0.9 DIASTOLIC ICA/CCA RATIO:  1.2 ECA:  127 cm/sec RIGHT CAROTID ARTERY: Little if any plaque in the bulb. Low resistance internal carotid Doppler pattern. RIGHT VERTEBRAL ARTERY:  Antegrade with a normal Doppler pattern. LEFT CAROTID ARTERY: Minimal smooth plaque along the bulb wall. Low resistance internal carotid Doppler pattern. LEFT VERTEBRAL ARTERY:  Antegrade.  Low resistance Doppler pattern. IMPRESSION: Less than 50% stenosis in the right and left internal carotid arteries. Electronically Signed   By: Jolaine ClickArthur  Hoss M.D.   On: 06/17/2015 09:26     Management plans discussed with the patient, family and they are in agreement.  CODE STATUS:     Code Status Orders        Start     Ordered   06/16/15 2016  Full code   Continuous     06/16/15 2016      TOTAL TIME TAKING CARE OF THIS PATIENT: 40 minutes.    Latesha Chesney M.D on 06/17/2015 at 12:01 PM  Between 7am to 6pm - Pager - [phone] After 6pm go to [email or web address] - password [email or web address] [facility]  [facility]  Office  [phone]  CC: Primary care physician; No primary  care provider on file.

## 2015-06-17 NOTE — Progress Notes (Signed)
Patient discharged home per MD order. VSS. All discharge instructions given and all questions answered.  

## 2016-07-27 IMAGING — CT CT ABD-PELV W/ CM
2 of 5 series · 15 of 46 positions shown, 17 images · IV contrast (omnipaque)
Comparison: None.

CLINICAL DATA: Acute periumbilical abdominal pain.

EXAM:
CT ABDOMEN AND PELVIS WITH CONTRAST
TECHNIQUE: Multidetector CT imaging of the abdomen and pelvis was performed
using the standard protocol following bolus administration of
intravenous contrast.
CONTRAST:  100 mL of Omnipaque 350 intravenously.

[Series 2: routine abd pel with · axial · 0.71mm/px · z∈[+42,+442]mm · 12 of 96 slices shown, 14 images]
[im 8/96  soft-tissue]
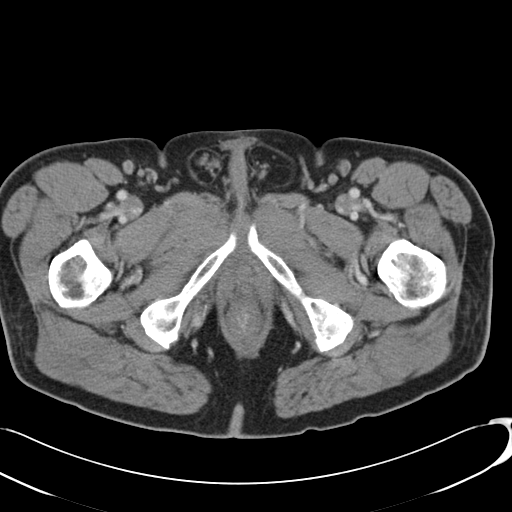
[im 8/96  bone]
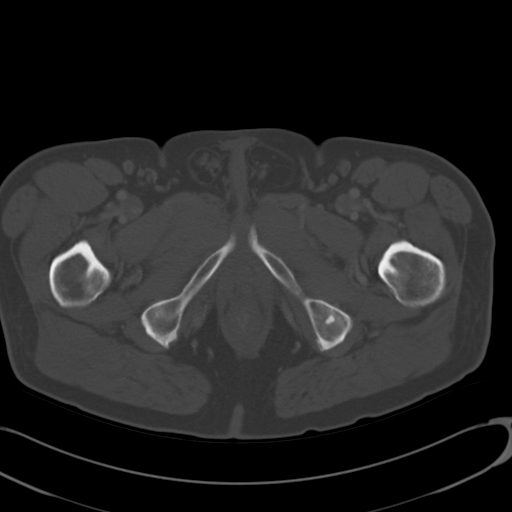
[im 15/96  soft-tissue]
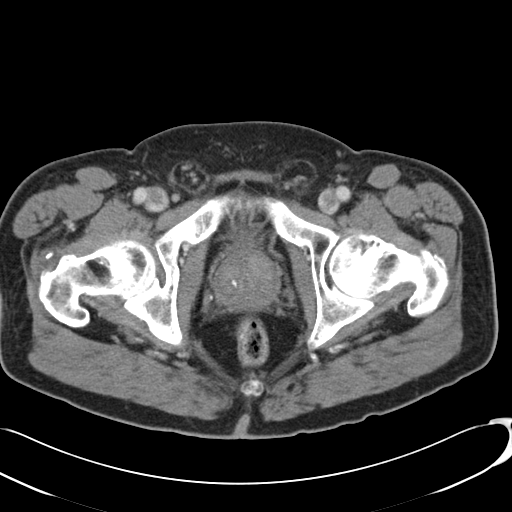
[im 22/96  soft-tissue]
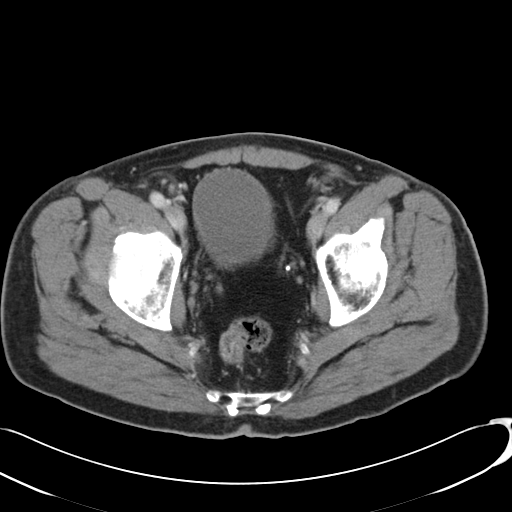
[im 30/96  soft-tissue]
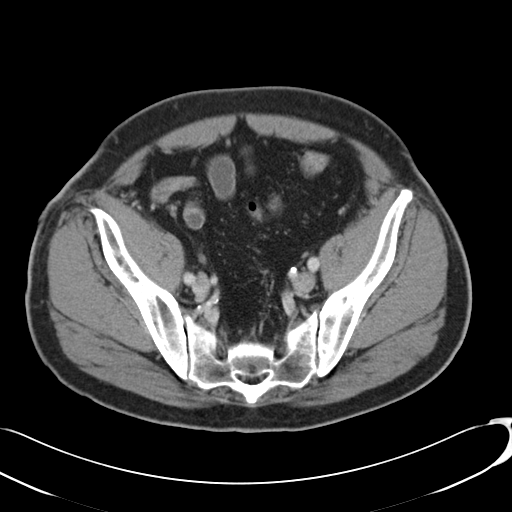
[im 37/96  soft-tissue]
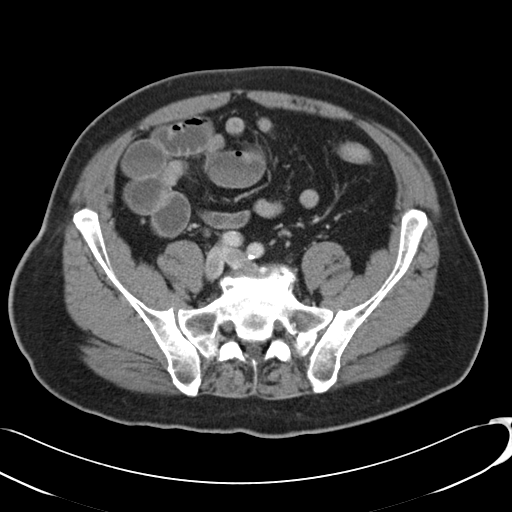
[im 44/96  soft-tissue]
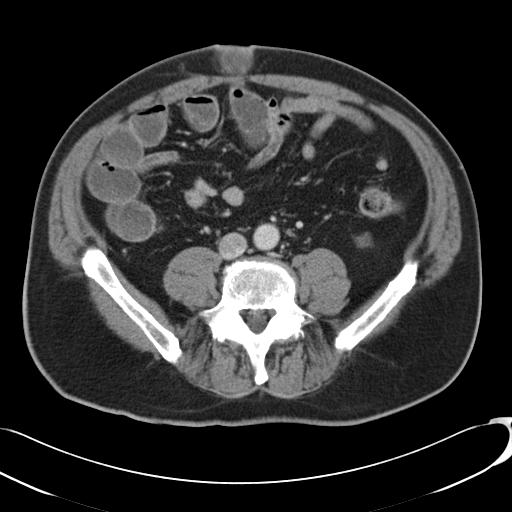
[im 52/96  soft-tissue]
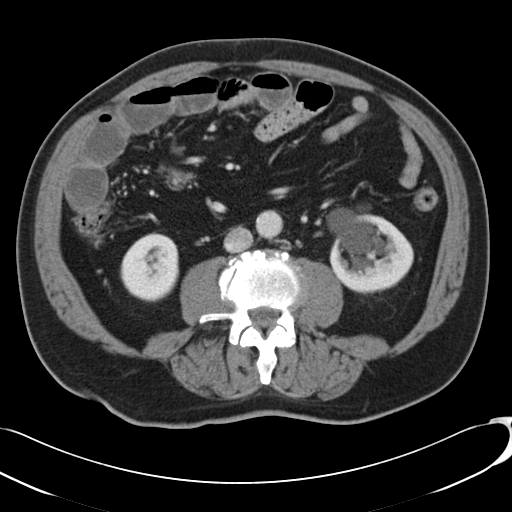
[im 59/96  soft-tissue]
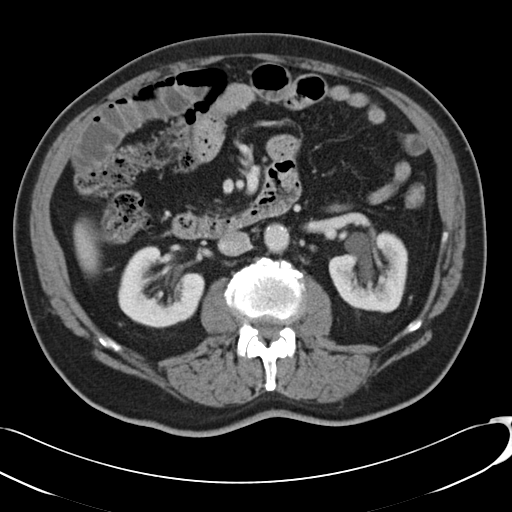
[im 66/96  soft-tissue]
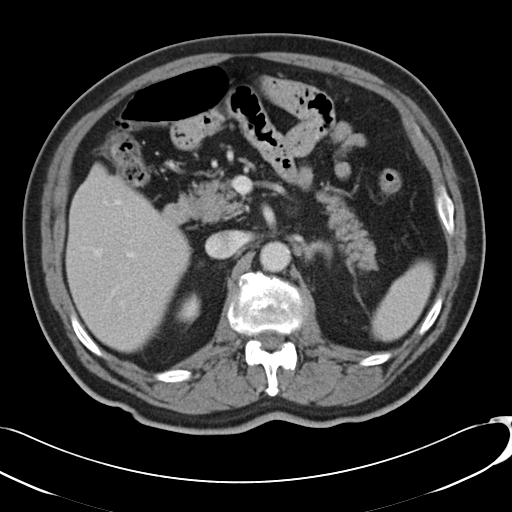
[im 66/96  bone]
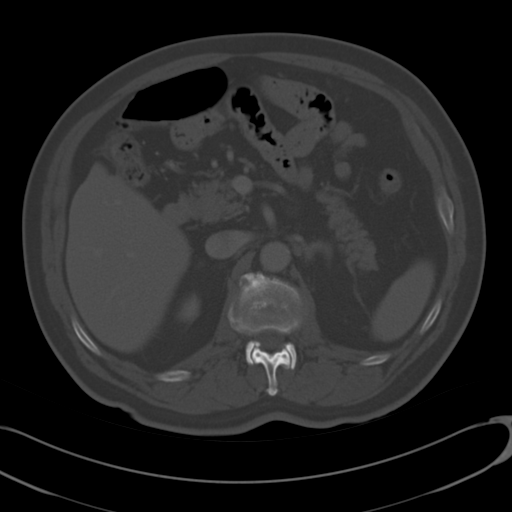
[im 74/96  soft-tissue]
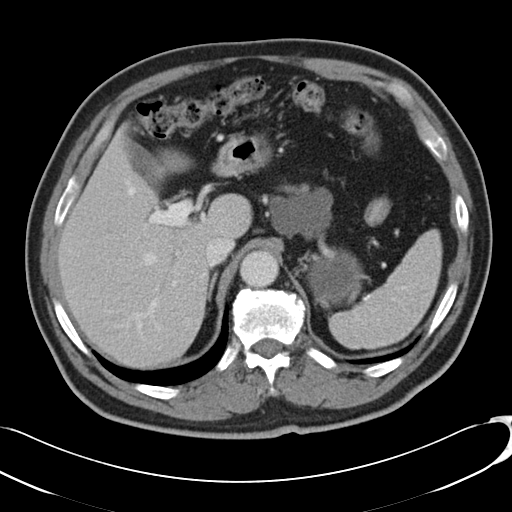
[im 81/96  soft-tissue]
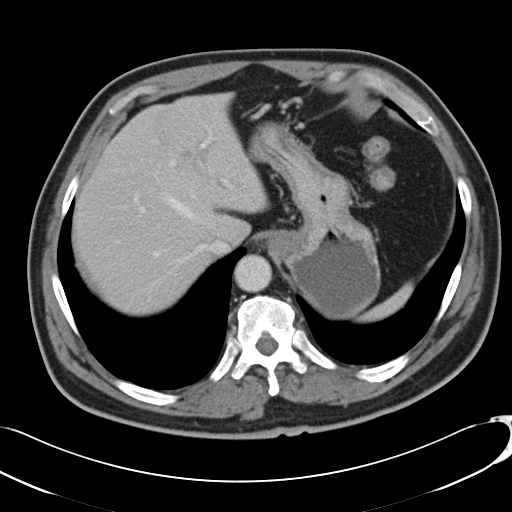
[im 88/96  soft-tissue]
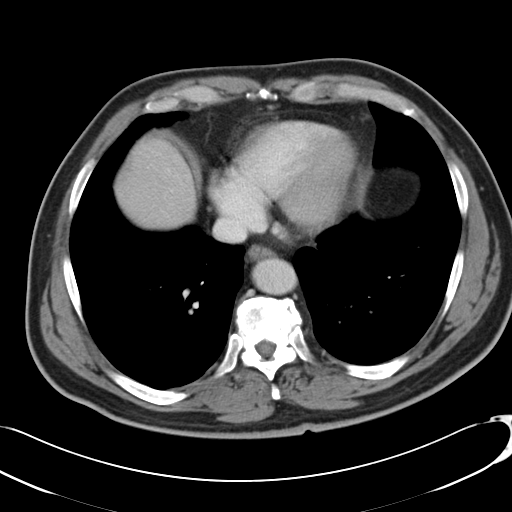

[Series 5: cor routine abd pel with · coronal · 0.71mm/px · 3 of 142 slices shown]
[im 48/142  soft-tissue]
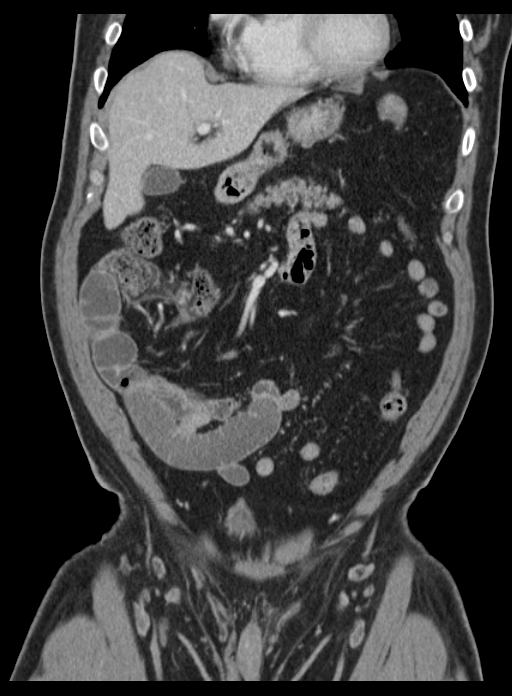
[im 63/142  soft-tissue]
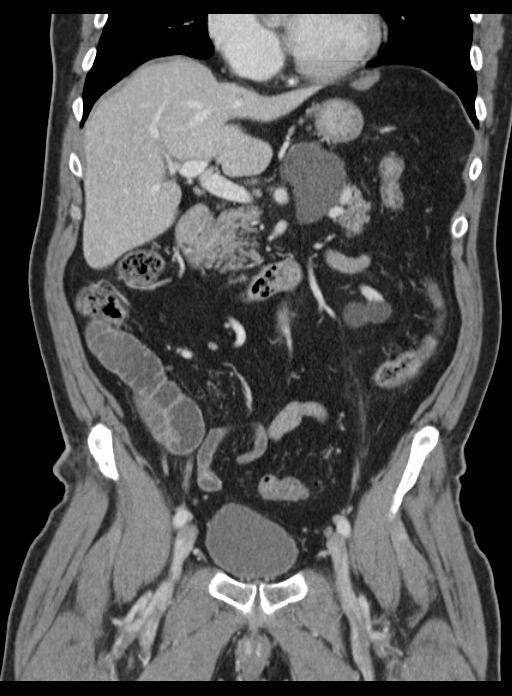
[im 79/142  soft-tissue]
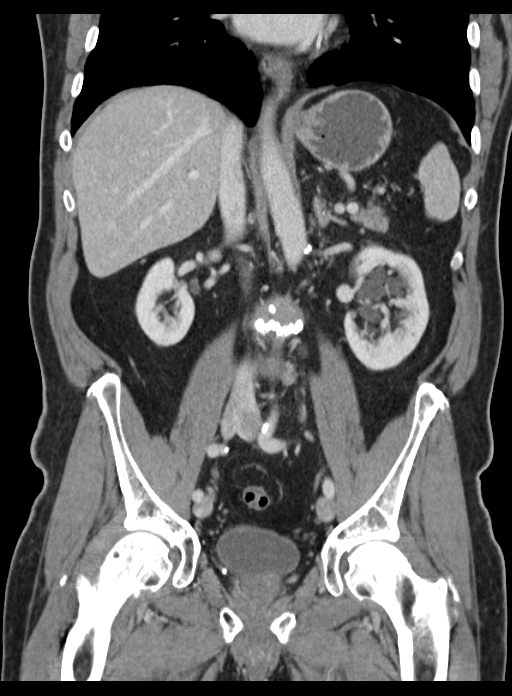

[15 of 46 positions shown; findings below may reference images not displayed]

FINDINGS: Degenerative disc disease is noted at L4-5 and L5-S1. Visualized
lung bases appear normal.

No gallstones are noted. The liver and spleen appear normal. 4.3 x
2.8 cm lobulated cystic area is seen posterior to the body of the
pancreas of unknown etiology. Adrenal glands appear normal.
Parapelvic cysts are seen involving the left kidney. No
hydronephrosis or renal obstruction is noted. No renal or ureteral
calculi are noted. Atherosclerotic calcifications of abdominal aorta
and iliac arteries are noted without aneurysm formation. Small
periumbilical hernia is noted which contains a loop of small bowel,
and results in proximal small bowel obstruction. No abnormal fluid
collection is noted. Urinary bladder appears normal. Mild prostatic
hypertrophy is noted. No significant adenopathy is noted.
IMPRESSION: Mild prostatic hypertrophy.

4.3 x 2.8 cm lobulated cystic abnormality seen posterior to the body
of the pancreas which may be arising from the pancreas. Followup CT
scan in 6-12 months is recommended to ensure stability.

Small periumbilical hernia is noted which contains a loop of
incarcerated small bowel, resulting in proximal small bowel
dilatation.

## 2017-08-05 ENCOUNTER — Ambulatory Visit
Admission: EM | Admit: 2017-08-05 | Discharge: 2017-08-05 | Disposition: A | Discharge status: Home / Self Care | Payer: Medicare Other | Attending: Emergency Medicine | Admitting: Emergency Medicine

## 2017-08-05 ENCOUNTER — Encounter: Payer: Self-pay | Admitting: *Deleted

## 2017-08-05 DIAGNOSIS — J9801 Acute bronchospasm: Secondary | ICD-10-CM

## 2017-08-05 DIAGNOSIS — J069 Acute upper respiratory infection, unspecified: Secondary | ICD-10-CM

## 2017-08-05 DIAGNOSIS — B9789 Other viral agents as the cause of diseases classified elsewhere: Secondary | ICD-10-CM | POA: Diagnosis not present

## 2017-08-05 DIAGNOSIS — R05 Cough: Secondary | ICD-10-CM

## 2017-08-05 MED ORDER — ALBUTEROL SULFATE HFA 108 (90 BASE) MCG/ACT IN AERS: 2.0000 | INHALATION_SPRAY | RESPIRATORY_TRACT | 0 refills | Status: DC | PRN

## 2017-08-05 MED ORDER — PREDNISONE 20 MG PO TABS: 40.0000 mg | ORAL_TABLET | Freq: Every day | ORAL | 0 refills | Status: DC

## 2017-08-05 MED ORDER — BENZONATATE 100 MG PO CAPS: 100.0000 mg | ORAL_CAPSULE | Freq: Three times a day (TID) | ORAL | 0 refills | Status: DC | PRN

## 2017-08-05 MED ORDER — DOXYCYCLINE HYCLATE 100 MG PO CAPS: 100.0000 mg | ORAL_CAPSULE | Freq: Two times a day (BID) | ORAL | 0 refills | Status: DC

## 2017-08-05 NOTE — Discharge Instructions (Signed)
Take medication as prescribed. Rest. Drink plenty of fluids.  ° °Follow up with your primary care physician this week as needed. Return to Urgent care for new or worsening concerns.  ° °

## 2017-08-05 NOTE — ED Provider Notes (Signed)
[facility] ____________________________________________  Time seen: Approximately 8:35 AM  I have reviewed the triage vital signs and the nursing notes.   HISTORY  Chief Complaint Cough and Otalgia   HPI Gregory [name] is a 77 y.o. male presenting for evaluation of 2-3 days of runny nose, nasal congestion, cough.  Patient states initially had some hot cold chills Friday, but denies known fevers.  States sore throat only present with coughing, denies sore throat with eating or drinking.  States that he has intermittently heard himself wheeze with coughing, and states that this is atypical for him.  Denies any accompanying chest pain, shortness of breath or hemoptysis.  States overall today he is feeling better, and states he is here "because my wife made me".  States has been taken some over-the-counter DayQuil and NyQuil with some improvement.  No over-the-counter medications taken today prior to arrival.  Denies recent sickness.  States his wife's stepson has been sick with some similar complaints this past week.  Reports continues to eat and drink well.  Denies other aggravating or alleviating factors.  Reports otherwise feels well. Denies chest pain, shortness of breath, abdominal pain, dysuria, extremity pain, extremity swelling or rash. Denies recent sickness. Denies recent antibiotic use.  Denies diabetes.  Denies renal insufficiency.  Denies COPD, bronchitis, asthma or pneumonia.  States has had one previous cardiac stent, no acute recent cardiac changes.  Takes baby aspirin daily.  PCP: Arlana Pouch   Past Medical History:  Diagnosis Date  . Coronary artery disease   . Hypertension   . Thyroid disease     Patient Active Problem List   Diagnosis Date Noted  . CVA (cerebral infarction) 06/16/2015    Past Surgical History:  Procedure Laterality Date  . CAROTID STENT N/A 2002  . UMBILICAL HERNIA REPAIR N/A 09/03/2014     No current facility-administered medications  for this encounter.   Current Outpatient Medications:  .  aspirin EC 81 MG tablet, Take 81 mg by mouth daily., Disp: , Rfl:  .  atorvastatin (LIPITOR) 40 MG tablet, Take 40 mg by mouth at bedtime., Disp: , Rfl:  .  levothyroxine (SYNTHROID, LEVOTHROID) 88 MCG tablet, Take 88 mcg by mouth daily., Disp: , Rfl:  .  metoprolol succinate (TOPROL-XL) 50 MG 24 hr tablet, Take 25 mg by mouth daily., Disp: , Rfl:  .  acetaminophen (TYLENOL) 650 MG CR tablet, Take 650 mg by mouth every 8 (eight) hours as needed for pain., Disp: , Rfl:  .  albuterol (PROVENTIL HFA;VENTOLIN HFA) 108 (90 Base) MCG/ACT inhaler, Inhale 2 puffs into the lungs every 4 (four) hours as needed for wheezing., Disp: 1 Inhaler, Rfl: 0 .  benzonatate (TESSALON PERLES) 100 MG capsule, Take 1 capsule (100 mg total) by mouth 3 (three) times daily as needed for cough., Disp: 15 capsule, Rfl: 0 .  doxycycline (VIBRAMYCIN) 100 MG capsule, Take 1 capsule (100 mg total) by mouth 2 (two) times daily., Disp: 20 capsule, Rfl: 0 .  predniSONE (DELTASONE) 20 MG tablet, Take 2 tablets (40 mg total) by mouth daily., Disp: 10 tablet, Rfl: 0  Allergies Patient has no known allergies.  Family History  Problem Relation Age of Onset  . Stroke Mother   . CAD Father     Social History Social History   Tobacco Use  . Smoking status: Never Smoker  . Smokeless tobacco: Never Used  Substance Use Topics  . Alcohol use: Yes    Alcohol/week: 0.6 oz  Types: 1 Cans of beer per week  . Drug use: No    Review of Systems Constitutional: As above.  Eyes: No visual changes. ENT: As above. Cardiovascular: Denies chest pain. Respiratory: Denies shortness of breath. Gastrointestinal: No abdominal pain.  No nausea, no vomiting.  No diarrhea.  Genitourinary: Negative for dysuria. Musculoskeletal: Negative for back pain. Skin: Negative for rash.  ____________________________________________   PHYSICAL EXAM:  VITAL SIGNS: ED Triage Vitals  Enc  Vitals Group     BP 08/05/17 0812 (!) 158/91     Pulse Rate 08/05/17 0812 60     Resp 08/05/17 0812 16     Temp 08/05/17 0812 98.1 F (36.7 C)     Temp Source 08/05/17 0812 Oral     SpO2 08/05/17 0812 96 %     Weight 08/05/17 0814 170 lb (77.1 kg)     Height 08/05/17 0814 5\' 7"  (1.702 m)     Head Circumference --      Peak Flow --      Pain Score --      Pain Loc --      Pain Edu? --      Excl. in GC? --     Constitutional: Alert and oriented. Well appearing and in no acute distress. Eyes: Conjunctivae are normal.  Head: Atraumatic. No sinus tenderness to palpation. No swelling. No erythema.  Ears: no erythema, normal TMs bilaterally.   Nose:Nasal congestion   Mouth/Throat: Mucous membranes are moist. No pharyngeal erythema. No tonsillar swelling or exudate.  Neck: No stridor.  No cervical spine tenderness to palpation. Hematological/Lymphatic/Immunilogical: No cervical lymphadenopathy. Cardiovascular: Normal rate, regular rhythm. Grossly normal heart sounds.  Good peripheral circulation. Respiratory: Normal respiratory effort.  No retractions. No wheezes, rales or rhonchi. Good air movement.  Dry intermittent cough noted with mild bronchospasm.  Speaks in complete sentences.  Gastrointestinal: Soft and nontender.  Musculoskeletal: Ambulatory with steady gait. Neurologic:  Normal speech and language. No gait instability. Skin:  Skin appears warm, dry and intact. No rash noted. Psychiatric: Mood and affect are normal. Speech and behavior are normal.  ___________________________________________   LABS (all labs ordered are listed, but only abnormal results are displayed)  Labs Reviewed - No data to display ____________________________________________  RADIOLOGY  No results found. ____________________________________________   PROCEDURES Procedures   INITIAL IMPRESSION / ASSESSMENT AND PLAN / ED COURSE  Pertinent labs & imaging results that were available during my  care of the patient were reviewed by me and considered in my medical decision making (see chart for details).  Well-appearing patient.  No acute distress.  Suspect viral illness.  However due to multiple comorbidities, discussed further plan with patient.  Will empirically start patient on PRN Tessalon Perles, albuterol inhaler and prednisone.  Discussed if symptoms persist past 2-3 more days then to start oral doxycycline.  Discussed prompt reevaluation for any worsening complaints.Discussed indication, risks and benefits of medications with patient.  Discussed follow up with Primary care physician this week. Discussed follow up and return parameters including no resolution or any worsening concerns. Patient verbalized understanding and agreed to plan.   ____________________________________________   FINAL CLINICAL IMPRESSION(S) / ED DIAGNOSES  Final diagnoses:  Viral URI with cough  Bronchospasm     ED Discharge Orders        Ordered    predniSONE (DELTASONE) 20 MG tablet  Daily     08/05/17 0835    albuterol (PROVENTIL HFA;VENTOLIN HFA) 108 (90 Base) MCG/ACT inhaler  Every  4 hours PRN     08/05/17 0835    benzonatate (TESSALON PERLES) 100 MG capsule  3 times daily PRN     08/05/17 0835    doxycycline (VIBRAMYCIN) 100 MG capsule  2 times daily     08/05/17 6962       Note: This dictation was prepared with Dragon dictation along with smaller phrase technology. Any transcriptional errors that result from this process are unintentional.         Renford Dills, NP 08/05/17 1020

## 2017-08-05 NOTE — ED Triage Notes (Signed)
Non-productive cough, bilateral ear region pain, and chest congestion x2 days.

## 2017-08-08 ENCOUNTER — Telehealth: Payer: Self-pay

## 2017-08-08 NOTE — Telephone Encounter (Signed)
Called to follow up with patient since visit here at [facility]. Spoke with pt. Reports improvement. Patient instructed to call back with any questions or concerns. MAH  

## 2018-01-30 ENCOUNTER — Other Ambulatory Visit: Payer: Self-pay | Admitting: Internal Medicine

## 2018-01-30 DIAGNOSIS — R42 Dizziness and giddiness: Secondary | ICD-10-CM

## 2018-01-30 DIAGNOSIS — R251 Tremor, unspecified: Secondary | ICD-10-CM

## 2018-02-04 ENCOUNTER — Ambulatory Visit
Admission: RE | Admit: 2018-02-04 | Discharge: 2018-02-04 | Disposition: A | Discharge status: Home / Self Care | Payer: Medicare Other | Source: Ambulatory Visit | Attending: Internal Medicine | Admitting: Internal Medicine

## 2018-02-04 DIAGNOSIS — R42 Dizziness and giddiness: Secondary | ICD-10-CM | POA: Insufficient documentation

## 2018-02-04 DIAGNOSIS — R251 Tremor, unspecified: Secondary | ICD-10-CM | POA: Diagnosis present

## 2020-05-19 ENCOUNTER — Encounter: Payer: Self-pay | Admitting: Ophthalmology

## 2020-05-19 ENCOUNTER — Other Ambulatory Visit: Payer: Self-pay

## 2020-05-21 ENCOUNTER — Other Ambulatory Visit
Admission: RE | Admit: 2020-05-21 | Discharge: 2020-05-21 | Disposition: A | Discharge status: Home / Self Care | Payer: Medicare Other | Source: Ambulatory Visit | Attending: Ophthalmology | Admitting: Ophthalmology

## 2020-05-21 ENCOUNTER — Other Ambulatory Visit: Payer: Self-pay

## 2020-05-21 DIAGNOSIS — Z20822 Contact with and (suspected) exposure to covid-19: Secondary | ICD-10-CM | POA: Diagnosis not present

## 2020-05-21 DIAGNOSIS — Z01812 Encounter for preprocedural laboratory examination: Secondary | ICD-10-CM | POA: Insufficient documentation

## 2020-05-21 NOTE — Discharge Instructions (Signed)

## 2020-05-22 LAB — SARS CORONAVIRUS 2 (TAT 6-24 HRS): SARS Coronavirus 2: NEGATIVE

## 2020-05-25 ENCOUNTER — Encounter
Admission: RE | Disposition: A | Discharge status: Home / Self Care | Payer: Self-pay | Source: Home / Self Care | Attending: Ophthalmology

## 2020-05-25 ENCOUNTER — Ambulatory Visit
Admission: RE | Admit: 2020-05-25 | Discharge: 2020-05-25 | Disposition: A | Discharge status: Home / Self Care | Payer: Medicare Other | Attending: Ophthalmology | Admitting: Ophthalmology

## 2020-05-25 ENCOUNTER — Encounter: Payer: Self-pay | Admitting: Ophthalmology

## 2020-05-25 ENCOUNTER — Ambulatory Visit: Payer: Medicare Other | Admitting: Anesthesiology

## 2020-05-25 ENCOUNTER — Other Ambulatory Visit: Payer: Self-pay

## 2020-05-25 DIAGNOSIS — H2511 Age-related nuclear cataract, right eye: Secondary | ICD-10-CM | POA: Diagnosis not present

## 2020-05-25 DIAGNOSIS — Z79899 Other long term (current) drug therapy: Secondary | ICD-10-CM | POA: Insufficient documentation

## 2020-05-25 DIAGNOSIS — Z955 Presence of coronary angioplasty implant and graft: Secondary | ICD-10-CM | POA: Insufficient documentation

## 2020-05-25 DIAGNOSIS — Z87891 Personal history of nicotine dependence: Secondary | ICD-10-CM | POA: Diagnosis not present

## 2020-05-25 DIAGNOSIS — Z7982 Long term (current) use of aspirin: Secondary | ICD-10-CM | POA: Insufficient documentation

## 2020-05-25 DIAGNOSIS — Z79890 Hormone replacement therapy (postmenopausal): Secondary | ICD-10-CM | POA: Insufficient documentation

## 2020-05-25 HISTORY — DX: Hypothyroidism, unspecified: E03.9

## 2020-05-25 HISTORY — PX: CATARACT EXTRACTION W/PHACO: SHX586

## 2020-05-25 HISTORY — DX: Bell's palsy: G51.0

## 2020-05-25 HISTORY — DX: Hyperlipidemia, unspecified: E78.5

## 2020-05-25 HISTORY — DX: Parkinson's disease: G20

## 2020-05-25 HISTORY — DX: Tremor, unspecified: R25.1

## 2020-05-25 HISTORY — DX: Parkinson's disease without dyskinesia, without mention of fluctuations: G20.A1

## 2020-05-25 SURGERY — PHACOEMULSIFICATION, CATARACT, WITH IOL INSERTION
Anesthesia: Monitor Anesthesia Care | Laterality: Right

## 2020-05-25 MED ORDER — LIDOCAINE HCL (PF) 2 % IJ SOLN
INTRAOCULAR | Status: DC | PRN
  Administered 2020-05-25: 1 mL

## 2020-05-25 MED ORDER — CEFUROXIME OPHTHALMIC INJECTION 1 MG/0.1 ML
INJECTION | OPHTHALMIC | Status: DC | PRN
  Administered 2020-05-25: 0.1 mL via INTRACAMERAL

## 2020-05-25 MED ORDER — NA HYALUR & NA CHOND-NA HYALUR 0.4-0.35 ML IO KIT
PACK | INTRAOCULAR | Status: DC | PRN
  Administered 2020-05-25: 1 mL via INTRAOCULAR

## 2020-05-25 MED ORDER — ACETAMINOPHEN 325 MG PO TABS: 325.0000 mg | ORAL_TABLET | Freq: Once | ORAL | Status: DC

## 2020-05-25 MED ORDER — ACETAMINOPHEN 160 MG/5ML PO SOLN: 325.0000 mg | Freq: Once | ORAL | Status: DC

## 2020-05-25 MED ORDER — FENTANYL CITRATE (PF) 100 MCG/2ML IJ SOLN
INTRAMUSCULAR | Status: DC | PRN
  Administered 2020-05-25: 25 ug via INTRAVENOUS

## 2020-05-25 MED ORDER — ARMC OPHTHALMIC DILATING DROPS
1.0000 "application " | OPHTHALMIC | Status: DC | PRN
  Administered 2020-05-25 (×3): 1 via OPHTHALMIC

## 2020-05-25 MED ORDER — MIDAZOLAM HCL 2 MG/2ML IJ SOLN
INTRAMUSCULAR | Status: DC | PRN
  Administered 2020-05-25: .5 mg via INTRAVENOUS

## 2020-05-25 MED ORDER — MOXIFLOXACIN HCL 0.5 % OP SOLN
1.0000 [drp] | OPHTHALMIC | Status: DC | PRN
  Administered 2020-05-25 (×3): 1 [drp] via OPHTHALMIC

## 2020-05-25 MED ORDER — TETRACAINE HCL 0.5 % OP SOLN
1.0000 [drp] | OPHTHALMIC | Status: DC | PRN
  Administered 2020-05-25 (×3): 1 [drp] via OPHTHALMIC

## 2020-05-25 MED ORDER — BRIMONIDINE TARTRATE-TIMOLOL 0.2-0.5 % OP SOLN
OPHTHALMIC | Status: DC | PRN
  Administered 2020-05-25: 1 [drp] via OPHTHALMIC

## 2020-05-25 MED ORDER — LACTATED RINGERS IV SOLN: INTRAVENOUS | Status: DC

## 2020-05-25 SURGICAL SUPPLY — 23 items
CANNULA ANT/CHMB 27G (MISCELLANEOUS) ×1 IMPLANT
CANNULA ANT/CHMB 27GA (MISCELLANEOUS) ×3 IMPLANT
GLOVE SURG LX 7.5 STRW (GLOVE) ×2
GLOVE SURG LX STRL 7.5 STRW (GLOVE) ×1 IMPLANT
GLOVE SURG TRIUMPH 8.0 PF LTX (GLOVE) ×3 IMPLANT
GOWN STRL REUS W/ TWL LRG LVL3 (GOWN DISPOSABLE) ×2 IMPLANT
GOWN STRL REUS W/TWL LRG LVL3 (GOWN DISPOSABLE) ×6
LENS IOL DIOP 8.0 (Intraocular Lens) ×3 IMPLANT
LENS IOL TECNIS MONO 8.0 (Intraocular Lens) IMPLANT
MARKER SKIN DUAL TIP RULER LAB (MISCELLANEOUS) ×3 IMPLANT
NDL CAPSULORHEX 25GA (NEEDLE) ×1 IMPLANT
NDL FILTER BLUNT 18X1 1/2 (NEEDLE) ×2 IMPLANT
NEEDLE CAPSULORHEX 25GA (NEEDLE) ×3 IMPLANT
NEEDLE FILTER BLUNT 18X 1/2SAF (NEEDLE) ×4
NEEDLE FILTER BLUNT 18X1 1/2 (NEEDLE) ×2 IMPLANT
PACK CATARACT BRASINGTON (MISCELLANEOUS) ×3 IMPLANT
PACK EYE AFTER SURG (MISCELLANEOUS) ×3 IMPLANT
PACK OPTHALMIC (MISCELLANEOUS) ×3 IMPLANT
SOLUTION OPHTHALMIC SALT (MISCELLANEOUS) ×3 IMPLANT
SYR 3ML LL SCALE MARK (SYRINGE) ×6 IMPLANT
SYR TB 1ML LUER SLIP (SYRINGE) ×3 IMPLANT
WATER STERILE IRR 250ML POUR (IV SOLUTION) ×3 IMPLANT
WIPE NON LINTING 3.25X3.25 (MISCELLANEOUS) ×3 IMPLANT

## 2020-05-25 NOTE — H&P (Signed)

## 2020-05-25 NOTE — Transfer of Care (Signed)
Immediate Anesthesia Transfer of Care Note  Patient: Gregory [name]  Procedure(s) Performed: CATARACT EXTRACTION PHACO AND INTRAOCULAR LENS PLACEMENT (IOC) RIGHT 6.90 01:14.4 9.2% (Right )  Patient Location: PACU  Anesthesia Type: MAC  Level of Consciousness: awake, alert  and patient cooperative  Airway and Oxygen Therapy: Patient Spontanous Breathing and Patient connected to supplemental oxygen  Post-op Assessment: Post-op Vital signs reviewed, Patient's Cardiovascular Status Stable, Respiratory Function Stable, Patent Airway and No signs of Nausea or vomiting  Post-op Vital Signs: Reviewed and stable  Complications: No complications documented.

## 2020-05-25 NOTE — Op Note (Signed)
LOCATION:  [facility]   PREOPERATIVE DIAGNOSIS:    Nuclear sclerotic cataract right eye. H25.11   POSTOPERATIVE DIAGNOSIS:  Nuclear sclerotic cataract right eye.     PROCEDURE:  Phacoemusification with posterior chamber intraocular lens placement of the right eye   ULTRASOUND TIME: Procedure(s): CATARACT EXTRACTION PHACO AND INTRAOCULAR LENS PLACEMENT (IOC) RIGHT 6.90 01:14.4 9.2% (Right)  LENS:   Implant Name Type Inv. Item Serial No. Manufacturer Lot No. LRB No. Used Action  LENS IOL DIOP 8.0 - [id number] Intraocular Lens LENS IOL DIOP 8.0 [id number]   Right 1 Implanted         SURGEON:  Deirdre Evener, MD   ANESTHESIA:  Topical with tetracaine drops and 2% Xylocaine jelly, augmented with 1% preservative-free intracameral lidocaine.    COMPLICATIONS:  None.   DESCRIPTION OF PROCEDURE:  The patient was identified in the holding room and transported to the operating room and placed in the supine position under the operating microscope.  The right eye was identified as the operative eye and it was prepped and draped in the usual sterile ophthalmic fashion.   A 1 millimeter clear-corneal paracentesis was made at the 12:00 position.  0.5 ml of preservative-free 1% lidocaine was injected into the anterior chamber. The anterior chamber was filled with Viscoat viscoelastic.  A 2.4 millimeter keratome was used to make a near-clear corneal incision at the 9:00 position.  A curvilinear capsulorrhexis was made with a cystotome and capsulorrhexis forceps.  Balanced salt solution was used to hydrodissect and hydrodelineate the nucleus.   Phacoemulsification was then used in stop and chop fashion to remove the lens nucleus and epinucleus.  The remaining cortex was then removed using the irrigation and aspiration handpiece. Provisc was then placed into the capsular bag to distend it for lens placement.  A lens was then injected into the capsular bag.  The remaining  viscoelastic was aspirated.   Wounds were hydrated with balanced salt solution.  The anterior chamber was inflated to a physiologic pressure with balanced salt solution.  No wound leaks were noted. Cefuroxime 0.1 ml of a 10mg /ml solution was injected into the anterior chamber for a dose of 1 mg of intracameral antibiotic at the completion of the case.   Timolol and Brimonidine drops were applied to the eye.  The patient was taken to the recovery room in stable condition without complications of anesthesia or surgery.   Gregory [name] 05/25/2020, 11:18 AM

## 2020-05-25 NOTE — Anesthesia Preprocedure Evaluation (Signed)
Anesthesia Evaluation  Patient identified by MRN, date of birth, ID band Patient awake    Reviewed: Allergy & Precautions, H&P , NPO status , Patient's Chart, lab work & pertinent test results  Airway Mallampati: II  TM Distance: >3 FB Neck ROM: full    Dental no notable dental hx.    Pulmonary Patient abstained from smoking.,    Pulmonary exam normal breath sounds clear to auscultation       Cardiovascular hypertension, + CAD  Normal cardiovascular exam Rhythm:regular Rate:Normal     Neuro/Psych Parkinson    GI/Hepatic   Endo/Other  Hypothyroidism   Renal/GU      Musculoskeletal   Abdominal   Peds  Hematology   Anesthesia Other Findings   Reproductive/Obstetrics                             Anesthesia Physical Anesthesia Plan  ASA: III  Anesthesia Plan: MAC   Post-op Pain Management:    Induction:   PONV Risk Score and Plan: 1 and Treatment may vary due to age or medical condition, TIVA and Midazolam  Airway Management Planned:   Additional Equipment:   Intra-op Plan:   Post-operative Plan:   Informed Consent: I have reviewed the patients History and Physical, chart, labs and discussed the procedure including the risks, benefits and alternatives for the proposed anesthesia with the patient or authorized representative who has indicated his/her understanding and acceptance.     Dental Advisory Given  Plan Discussed with: CRNA  Anesthesia Plan Comments:         Anesthesia Quick Evaluation

## 2020-05-25 NOTE — Anesthesia Postprocedure Evaluation (Signed)
Anesthesia Post Note  Patient: Gregory [name]  Procedure(s) Performed: CATARACT EXTRACTION PHACO AND INTRAOCULAR LENS PLACEMENT (IOC) RIGHT 6.90 01:14.4 9.2% (Right )     Patient location during evaluation: PACU Anesthesia Type: MAC Level of consciousness: awake and alert and oriented Pain management: satisfactory to patient Vital Signs Assessment: post-procedure vital signs reviewed and stable Respiratory status: spontaneous breathing, nonlabored ventilation and respiratory function stable Cardiovascular status: blood pressure returned to baseline and stable Postop Assessment: Adequate PO intake and No signs of nausea or vomiting Anesthetic complications: no   No complications documented.  Raliegh Ip

## 2020-05-26 ENCOUNTER — Encounter: Payer: Self-pay | Admitting: Ophthalmology

## 2020-05-26 MED ORDER — EPINEPHRINE PF 1 MG/ML IJ SOLN
INTRAOCULAR | Status: DC | PRN
  Administered 2020-05-25: 68 mL via OPHTHALMIC

## 2020-05-26 NOTE — OR Nursing (Signed)
Entered chart to correct dosage for DuoVisc.

## 2020-06-14 ENCOUNTER — Encounter: Payer: Self-pay | Admitting: Ophthalmology

## 2020-06-21 ENCOUNTER — Other Ambulatory Visit
Admission: RE | Admit: 2020-06-21 | Discharge: 2020-06-21 | Disposition: A | Discharge status: Home / Self Care | Payer: Medicare Other | Source: Ambulatory Visit | Attending: Ophthalmology | Admitting: Ophthalmology

## 2020-06-21 ENCOUNTER — Other Ambulatory Visit: Payer: Self-pay

## 2020-06-21 DIAGNOSIS — Z20822 Contact with and (suspected) exposure to covid-19: Secondary | ICD-10-CM | POA: Diagnosis not present

## 2020-06-21 DIAGNOSIS — Z01812 Encounter for preprocedural laboratory examination: Secondary | ICD-10-CM | POA: Insufficient documentation

## 2020-06-21 NOTE — Discharge Instructions (Signed)

## 2020-06-22 LAB — SARS CORONAVIRUS 2 (TAT 6-24 HRS): SARS Coronavirus 2: NEGATIVE

## 2020-06-23 ENCOUNTER — Ambulatory Visit: Payer: Medicare Other | Admitting: Anesthesiology

## 2020-06-23 ENCOUNTER — Ambulatory Visit
Admission: RE | Admit: 2020-06-23 | Discharge: 2020-06-23 | Disposition: A | Discharge status: Home / Self Care | Payer: Medicare Other | Attending: Ophthalmology | Admitting: Ophthalmology

## 2020-06-23 ENCOUNTER — Other Ambulatory Visit: Payer: Self-pay

## 2020-06-23 ENCOUNTER — Encounter
Admission: RE | Disposition: A | Discharge status: Home / Self Care | Payer: Self-pay | Source: Home / Self Care | Attending: Ophthalmology

## 2020-06-23 DIAGNOSIS — H2512 Age-related nuclear cataract, left eye: Secondary | ICD-10-CM | POA: Diagnosis present

## 2020-06-23 DIAGNOSIS — G20 Parkinson's disease: Secondary | ICD-10-CM | POA: Diagnosis not present

## 2020-06-23 DIAGNOSIS — I2510 Atherosclerotic heart disease of native coronary artery without angina pectoris: Secondary | ICD-10-CM | POA: Insufficient documentation

## 2020-06-23 DIAGNOSIS — Z9841 Cataract extraction status, right eye: Secondary | ICD-10-CM | POA: Diagnosis not present

## 2020-06-23 DIAGNOSIS — I10 Essential (primary) hypertension: Secondary | ICD-10-CM | POA: Diagnosis not present

## 2020-06-23 HISTORY — PX: CATARACT EXTRACTION W/PHACO: SHX586

## 2020-06-23 SURGERY — PHACOEMULSIFICATION, CATARACT, WITH IOL INSERTION
Anesthesia: Monitor Anesthesia Care | Site: Eye | Laterality: Left

## 2020-06-23 MED ORDER — EPINEPHRINE PF 1 MG/ML IJ SOLN
INTRAOCULAR | Status: DC | PRN
  Administered 2020-06-23: 61 mL via OPHTHALMIC

## 2020-06-23 MED ORDER — CEFUROXIME OPHTHALMIC INJECTION 1 MG/0.1 ML
INJECTION | OPHTHALMIC | Status: DC | PRN
  Administered 2020-06-23: 0.1 mL via INTRACAMERAL

## 2020-06-23 MED ORDER — BRIMONIDINE TARTRATE-TIMOLOL 0.2-0.5 % OP SOLN
OPHTHALMIC | Status: DC | PRN
  Administered 2020-06-23: 1 [drp] via OPHTHALMIC

## 2020-06-23 MED ORDER — ARMC OPHTHALMIC DILATING DROPS
1.0000 "application " | OPHTHALMIC | Status: DC | PRN
  Administered 2020-06-23 (×3): 1 via OPHTHALMIC

## 2020-06-23 MED ORDER — FENTANYL CITRATE (PF) 100 MCG/2ML IJ SOLN
INTRAMUSCULAR | Status: DC | PRN
  Administered 2020-06-23: 50 ug via INTRAVENOUS

## 2020-06-23 MED ORDER — MIDAZOLAM HCL 2 MG/2ML IJ SOLN
INTRAMUSCULAR | Status: DC | PRN
  Administered 2020-06-23: 1 mg via INTRAVENOUS

## 2020-06-23 MED ORDER — TETRACAINE HCL 0.5 % OP SOLN
1.0000 [drp] | OPHTHALMIC | Status: DC | PRN
  Administered 2020-06-23 (×3): 1 [drp] via OPHTHALMIC

## 2020-06-23 MED ORDER — NA HYALUR & NA CHOND-NA HYALUR 0.4-0.35 ML IO KIT
PACK | INTRAOCULAR | Status: DC | PRN
  Administered 2020-06-23: 1 mL via INTRAOCULAR

## 2020-06-23 MED ORDER — LIDOCAINE HCL (PF) 2 % IJ SOLN
INTRAOCULAR | Status: DC | PRN
  Administered 2020-06-23: 2 mL

## 2020-06-23 SURGICAL SUPPLY — 23 items
CANNULA ANT/CHMB 27G (MISCELLANEOUS) ×1 IMPLANT
CANNULA ANT/CHMB 27GA (MISCELLANEOUS) ×3 IMPLANT
GLOVE SURG LX 7.5 STRW (GLOVE) ×2
GLOVE SURG LX STRL 7.5 STRW (GLOVE) ×1 IMPLANT
GLOVE SURG TRIUMPH 8.0 PF LTX (GLOVE) ×3 IMPLANT
GOWN STRL REUS W/ TWL LRG LVL3 (GOWN DISPOSABLE) ×2 IMPLANT
GOWN STRL REUS W/TWL LRG LVL3 (GOWN DISPOSABLE) ×6
LENS IOL DIOP 9.0 (Intraocular Lens) ×3 IMPLANT
LENS IOL TECNIS MONO 9.0 (Intraocular Lens) IMPLANT
MARKER SKIN DUAL TIP RULER LAB (MISCELLANEOUS) ×3 IMPLANT
NDL CAPSULORHEX 25GA (NEEDLE) ×1 IMPLANT
NDL FILTER BLUNT 18X1 1/2 (NEEDLE) ×2 IMPLANT
NEEDLE CAPSULORHEX 25GA (NEEDLE) ×3 IMPLANT
NEEDLE FILTER BLUNT 18X 1/2SAF (NEEDLE) ×4
NEEDLE FILTER BLUNT 18X1 1/2 (NEEDLE) ×2 IMPLANT
PACK CATARACT BRASINGTON (MISCELLANEOUS) ×3 IMPLANT
PACK EYE AFTER SURG (MISCELLANEOUS) ×3 IMPLANT
PACK OPTHALMIC (MISCELLANEOUS) ×3 IMPLANT
SOLUTION OPHTHALMIC SALT (MISCELLANEOUS) ×3 IMPLANT
SYR 3ML LL SCALE MARK (SYRINGE) ×6 IMPLANT
SYR TB 1ML LUER SLIP (SYRINGE) ×3 IMPLANT
WATER STERILE IRR 250ML POUR (IV SOLUTION) ×3 IMPLANT
WIPE NON LINTING 3.25X3.25 (MISCELLANEOUS) ×3 IMPLANT

## 2020-06-23 NOTE — Anesthesia Procedure Notes (Signed)
Procedure Name: MAC Date/Time: 06/23/2020 1:25 PM Performed by: Jeannene Patella, CRNA Pre-anesthesia Checklist: Patient identified, Emergency Drugs available, Suction available, Timeout performed and Patient being monitored Patient Re-evaluated:Patient Re-evaluated prior to induction Oxygen Delivery Method: Nasal cannula Placement Confirmation: positive ETCO2

## 2020-06-23 NOTE — Op Note (Signed)
OPERATIVE NOTE  Gregory [name] [id number] 06/23/2020   PREOPERATIVE DIAGNOSIS:  Nuclear sclerotic cataract left eye. H25.12   POSTOPERATIVE DIAGNOSIS:    Nuclear sclerotic cataract left eye.     PROCEDURE:  Phacoemusification with posterior chamber intraocular lens placement of the left eye  Ultrasound time: Procedure(s): CATARACT EXTRACTION PHACO AND INTRAOCULAR LENS PLACEMENT (IOC) LEFT 3.85 01:00.7 6.4% (Left)  LENS:   Implant Name Type Inv. Item Serial No. Manufacturer Lot No. LRB No. Used Action  LENS IOL DIOP 9.0 - [id number] Intraocular Lens LENS IOL DIOP 9.0 [id number]   Left 1 Implanted      SURGEON:  Deirdre Evener, MD   ANESTHESIA:  Topical with tetracaine drops and 2% Xylocaine jelly, augmented with 1% preservative-free intracameral lidocaine.    COMPLICATIONS:  None.   DESCRIPTION OF PROCEDURE:  The patient was identified in the holding room and transported to the operating room and placed in the supine position under the operating microscope.  The left eye was identified as the operative eye and it was prepped and draped in the usual sterile ophthalmic fashion.   A 1 millimeter clear-corneal paracentesis was made at the 1:30 position.  0.5 ml of preservative-free 1% lidocaine was injected into the anterior chamber.  The anterior chamber was filled with Viscoat viscoelastic.  A 2.4 millimeter keratome was used to make a near-clear corneal incision at the 10:30 position.  .  A curvilinear capsulorrhexis was made with a cystotome and capsulorrhexis forceps.  Balanced salt solution was used to hydrodissect and hydrodelineate the nucleus.   Phacoemulsification was then used in stop and chop fashion to remove the lens nucleus and epinucleus.  The remaining cortex was then removed using the irrigation and aspiration handpiece. Provisc was then placed into the capsular bag to distend it for lens placement.  A lens was then injected into the capsular bag.  The  remaining viscoelastic was aspirated.   Wounds were hydrated with balanced salt solution.  The anterior chamber was inflated to a physiologic pressure with balanced salt solution.  No wound leaks were noted. Cefuroxime 0.1 ml of a 10mg /ml solution was injected into the anterior chamber for a dose of 1 mg of intracameral antibiotic at the completion of the case.   Timolol and Brimonidine drops were applied to the eye.  The patient was taken to the recovery room in stable condition without complications of anesthesia or surgery.  Gregory [name] 06/23/2020, 1:40 PM

## 2020-06-23 NOTE — Transfer of Care (Signed)
Immediate Anesthesia Transfer of Care Note  Patient: Gregory [name]  Procedure(s) Performed: CATARACT EXTRACTION PHACO AND INTRAOCULAR LENS PLACEMENT (IOC) LEFT 3.85 01:00.7 6.4% (Left Eye)  Patient Location: PACU  Anesthesia Type: MAC  Level of Consciousness: awake, alert  and patient cooperative  Airway and Oxygen Therapy: Patient Spontanous Breathing and Patient connected to supplemental oxygen  Post-op Assessment: Post-op Vital signs reviewed, Patient's Cardiovascular Status Stable, Respiratory Function Stable, Patent Airway and No signs of Nausea or vomiting  Post-op Vital Signs: Reviewed and stable  Complications: No complications documented.

## 2020-06-23 NOTE — Anesthesia Postprocedure Evaluation (Signed)
Anesthesia Post Note  Patient: Gregory [name]  Procedure(s) Performed: CATARACT EXTRACTION PHACO AND INTRAOCULAR LENS PLACEMENT (IOC) LEFT 3.85 01:00.7 6.4% (Left Eye)     Patient location during evaluation: PACU Anesthesia Type: MAC Level of consciousness: awake and alert Pain management: pain level controlled Vital Signs Assessment: post-procedure vital signs reviewed and stable Respiratory status: spontaneous breathing, nonlabored ventilation, respiratory function stable and patient connected to nasal cannula oxygen Cardiovascular status: stable and blood pressure returned to baseline Postop Assessment: no apparent nausea or vomiting Anesthetic complications: no   No complications documented.  [name]

## 2020-06-23 NOTE — Anesthesia Preprocedure Evaluation (Signed)
Anesthesia Evaluation  Patient identified by MRN, date of birth, ID band Patient awake    Reviewed: Allergy & Precautions, H&P , NPO status , Patient's Chart, lab work & pertinent test results  Airway Mallampati: II  TM Distance: >3 FB Neck ROM: full    Dental no notable dental hx.    Pulmonary neg pulmonary ROS, Patient abstained from smoking.,    Pulmonary exam normal breath sounds clear to auscultation       Cardiovascular Exercise Tolerance: Good hypertension, + CAD  Normal cardiovascular exam Rhythm:regular Rate:Normal     Neuro/Psych Parkinson  Neuromuscular disease negative psych ROS   GI/Hepatic negative GI ROS,   Endo/Other  Hypothyroidism   Renal/GU   negative genitourinary   Musculoskeletal negative musculoskeletal ROS (+)   Abdominal   Peds negative pediatric ROS (+)  Hematology   Anesthesia Other Findings   Reproductive/Obstetrics negative OB ROS                             Anesthesia Physical  Anesthesia Plan  ASA: III  Anesthesia Plan: MAC   Post-op Pain Management:    Induction:   PONV Risk Score and Plan: 1 and Treatment may vary due to age or medical condition, TIVA and Midazolam  Airway Management Planned: Natural Airway and Nasal Cannula  Additional Equipment:   Intra-op Plan:   Post-operative Plan:   Informed Consent: I have reviewed the patients History and Physical, chart, labs and discussed the procedure including the risks, benefits and alternatives for the proposed anesthesia with the patient or authorized representative who has indicated his/her understanding and acceptance.     Dental Advisory Given and Dental advisory given  Plan Discussed with: CRNA  Anesthesia Plan Comments:         Anesthesia Quick Evaluation

## 2020-06-23 NOTE — H&P (Signed)

## 2020-06-24 ENCOUNTER — Encounter: Payer: Self-pay | Admitting: Ophthalmology

## 2021-11-18 ENCOUNTER — Other Ambulatory Visit: Payer: Self-pay | Admitting: Neurology

## 2021-11-18 DIAGNOSIS — F801 Expressive language disorder: Secondary | ICD-10-CM

## 2021-11-18 DIAGNOSIS — R251 Tremor, unspecified: Secondary | ICD-10-CM

## 2021-11-18 DIAGNOSIS — R270 Ataxia, unspecified: Secondary | ICD-10-CM

## 2021-11-18 DIAGNOSIS — R42 Dizziness and giddiness: Secondary | ICD-10-CM

## 2021-11-24 ENCOUNTER — Ambulatory Visit
Admission: RE | Admit: 2021-11-24 | Discharge: 2021-11-24 | Disposition: A | Discharge status: Home / Self Care | Payer: Medicare Other | Source: Ambulatory Visit | Attending: Neurology | Admitting: Neurology

## 2021-11-24 DIAGNOSIS — R42 Dizziness and giddiness: Secondary | ICD-10-CM | POA: Insufficient documentation

## 2021-11-24 DIAGNOSIS — F801 Expressive language disorder: Secondary | ICD-10-CM | POA: Diagnosis present

## 2021-11-24 DIAGNOSIS — R251 Tremor, unspecified: Secondary | ICD-10-CM | POA: Insufficient documentation

## 2021-11-24 DIAGNOSIS — R270 Ataxia, unspecified: Secondary | ICD-10-CM | POA: Diagnosis present

## 2024-02-08 ENCOUNTER — Other Ambulatory Visit: Payer: Self-pay

## 2024-02-08 ENCOUNTER — Encounter: Payer: Self-pay | Admitting: Emergency Medicine

## 2024-02-08 ENCOUNTER — Emergency Department
Admission: EM | Admit: 2024-02-08 | Discharge: 2024-02-08 | Disposition: A | Discharge status: Home / Self Care | Attending: Emergency Medicine | Admitting: Emergency Medicine

## 2024-02-08 DIAGNOSIS — I2510 Atherosclerotic heart disease of native coronary artery without angina pectoris: Secondary | ICD-10-CM | POA: Insufficient documentation

## 2024-02-08 DIAGNOSIS — Z79899 Other long term (current) drug therapy: Secondary | ICD-10-CM | POA: Diagnosis not present

## 2024-02-08 DIAGNOSIS — G20A1 Parkinson's disease without dyskinesia, without mention of fluctuations: Secondary | ICD-10-CM | POA: Insufficient documentation

## 2024-02-08 DIAGNOSIS — R42 Dizziness and giddiness: Secondary | ICD-10-CM | POA: Diagnosis not present

## 2024-02-08 DIAGNOSIS — E039 Hypothyroidism, unspecified: Secondary | ICD-10-CM | POA: Insufficient documentation

## 2024-02-08 DIAGNOSIS — Z8673 Personal history of transient ischemic attack (TIA), and cerebral infarction without residual deficits: Secondary | ICD-10-CM | POA: Insufficient documentation

## 2024-02-08 DIAGNOSIS — I10 Essential (primary) hypertension: Secondary | ICD-10-CM | POA: Diagnosis not present

## 2024-02-08 DIAGNOSIS — R001 Bradycardia, unspecified: Secondary | ICD-10-CM | POA: Insufficient documentation

## 2024-02-08 LAB — COMPREHENSIVE METABOLIC PANEL WITH GFR
ALT: 26 U/L (ref 0–44)
AST: 31 U/L (ref 15–41)
Albumin: 3.5 g/dL (ref 3.5–5.0)
Alkaline Phosphatase: 52 U/L (ref 38–126)
Anion gap: 11 (ref 5–15)
BUN: 18 mg/dL (ref 8–23)
CO2: 25 mmol/L (ref 22–32)
Calcium: 9.1 mg/dL (ref 8.9–10.3)
Chloride: 104 mmol/L (ref 98–111)
Creatinine, Ser: 0.8 mg/dL (ref 0.61–1.24)
GFR, Estimated: >60 mL/min (ref >60)
Glucose, Bld: 106 mg/dL — ABNORMAL HIGH (ref 70–99)
Potassium: 4.3 mmol/L (ref 3.5–5.1)
Sodium: 140 mmol/L (ref 135–145)
Total Bilirubin: 0.8 mg/dL (ref 0.0–1.2)
Total Protein: 6.1 g/dL — ABNORMAL LOW (ref 6.5–8.1)

## 2024-02-08 LAB — URINALYSIS, ROUTINE W REFLEX MICROSCOPIC
Bilirubin Urine: NEGATIVE
Glucose, UA: 150 mg/dL — AB
Hgb urine dipstick: NEGATIVE
Ketones, ur: NEGATIVE mg/dL
Leukocytes,Ua: NEGATIVE
Nitrite: NEGATIVE
Protein, ur: NEGATIVE mg/dL
Specific Gravity, Urine: 1.015 (ref 1.005–1.030)
pH: 6 (ref 5.0–8.0)

## 2024-02-08 LAB — CBC
HCT: 47.6 % (ref 39.0–52.0)
Hemoglobin: 14.9 g/dL (ref 13.0–17.0)
MCH: 26.7 pg (ref 26.0–34.0)
MCHC: 31.3 g/dL (ref 30.0–36.0)
MCV: 85.2 fL (ref 80.0–100.0)
Platelets: 262 K/uL (ref 150–400)
RBC: 5.59 MIL/uL (ref 4.22–5.81)
RDW: 13.5 % (ref 11.5–15.5)
WBC: 10.5 K/uL (ref 4.0–10.5)
nRBC: 0 % (ref 0.0–0.2)

## 2024-02-08 LAB — MAGNESIUM: Magnesium: 2.4 mg/dL (ref 1.7–2.4)

## 2024-02-08 LAB — TSH: TSH: 7.778 u[IU]/mL — ABNORMAL HIGH (ref 0.350–4.500)

## 2024-02-08 LAB — T4, FREE: Free T4: 0.99 ng/dL (ref 0.61–1.12)

## 2024-02-08 LAB — CBG MONITORING, ED: Glucose-Capillary: 94 mg/dL (ref 70–99)

## 2024-02-08 MED ORDER — CARBIDOPA-LEVODOPA 25-100 MG PO TABS
2.0000 | ORAL_TABLET | Freq: Once | ORAL | Status: AC
  Administered 2024-02-08: 2 via ORAL
  Filled 2024-02-08: qty 2

## 2024-02-08 NOTE — Discharge Instructions (Addendum)
 Your heart rate was found to be low today, which could be contributing to your longstanding dizziness.  You should stop taking metoprolol  and schedule a follow-up appointment with a cardiologist.  You should receive a call from their office in the next couple of days, but please call the included number if you do not hear from them.  You should return to the ER for reevaluation for new or worsening symptoms.  You should also have your thyroid function rechecked by your primary care doctor.

## 2024-02-08 NOTE — ED Provider Notes (Signed)
 [facility] Provider Note    Event Date/Time   First MD Initiated Contact with Patient 02/08/24 1831     (approximate)   History   Chief Complaint Dizziness and Bradycardia   HPI  Gregory [name] is a 83 y.o. male with past medical history of hypertension, CAD, stroke, hypothyroidism, and Parkinson's disease who presents to the ED complaining of dizziness.  Patient had a home PT visit earlier today, where they checked his vital signs and found his heart rate to be in the 30s.  Patient reports that he feels dizzy in the morning when he gets up and that this has been going on for greater than 1 year.  Family reports that he deals with chronic dizziness and lightheadedness throughout the day, which had been no worse than usual today.  He denies any chest pain or shortness of breath, has not felt like he is going to pass out.  He does take metoprolol  and his cardiologist reduced his dose of metoprolol  in December of last year due to the symptoms.     Physical Exam   Triage Vital Signs: ED Triage Vitals  Encounter Vitals Group     BP 02/08/24 1551 (!) 119/90     Girls Systolic BP Percentile --      Girls Diastolic BP Percentile --      Boys Systolic BP Percentile --      Boys Diastolic BP Percentile --      Pulse Rate 02/08/24 1551 67     Resp 02/08/24 1551 17     Temp 02/08/24 1551 97.7 F (36.5 C)     Temp Source 02/08/24 1551 Oral     SpO2 02/08/24 1551 100 %     Weight 02/08/24 1602 140 lb (63.5 kg)     Height 02/08/24 1602 5' 7 (1.702 m)     Head Circumference --      Peak Flow --      Pain Score 02/08/24 1602 0     Pain Loc --      Pain Education --      Exclude from Growth Chart --     Most recent vital signs: Vitals:   02/08/24 1839 02/08/24 2130  BP: 117/74 (!) 132/93  Pulse: (!) 53 64  Resp: 14 17  Temp:    SpO2: 100% 99%    Constitutional: Alert and oriented. Eyes: Conjunctivae are normal. Head: Atraumatic. Nose: No  congestion/rhinnorhea. Mouth/Throat: Mucous membranes are moist.  Cardiovascular: Bradycardic, regular rhythm. Grossly normal heart sounds.  2+ radial pulses bilaterally. Respiratory: Normal respiratory effort.  No retractions. Lungs CTAB. Gastrointestinal: Soft and nontender. No distention. Musculoskeletal: No lower extremity tenderness nor edema.  Neurologic:  Normal speech and language. No gross focal neurologic deficits are appreciated.    ED Results / Procedures / Treatments   Labs (all labs ordered are listed, but only abnormal results are displayed) Labs Reviewed  URINALYSIS, ROUTINE W REFLEX MICROSCOPIC - Abnormal; Notable for the following components:      Result Value   Color, Urine YELLOW (*)    APPearance CLEAR (*)    Glucose, UA 150 (*)    All other components within normal limits  COMPREHENSIVE METABOLIC PANEL WITH GFR - Abnormal; Notable for the following components:   Glucose, Bld 106 (*)    Total Protein 6.1 (*)    All other components within normal limits  TSH - Abnormal; Notable for the following components:   TSH 7.778 (*)  All other components within normal limits  CBC  MAGNESIUM  T4, FREE  CBG MONITORING, ED  CBG MONITORING, ED     EKG  ED ECG REPORT I, Carlin Palin, the attending physician, personally viewed and interpreted this ECG.   Date: 02/08/2024  EKG Time: 15:44  Rate: 61  Rhythm: sinus bradycardia, PAC's noted  Axis: Normal  Intervals:none  ST&T Change: None  PROCEDURES:  Critical Care performed: No  Procedures   MEDICATIONS ORDERED IN ED: Medications  carbidopa-levodopa (SINEMET IR) 25-100 MG per tablet immediate release 2 tablet (2 tablets Oral Given 02/08/24 2140)     IMPRESSION / MDM / ASSESSMENT AND PLAN / ED COURSE  I reviewed the triage vital signs and the nursing notes.                              83 y.o. male with past medical history of hypertension, CAD, stroke, hypothyroidism, and Parkinson's disease who  presents to the ED complaining of chronic dizziness and lightheadedness with low heart rate noted by his physical therapist today.  Patient's presentation is most consistent with acute presentation with potential threat to life or bodily function.  Differential diagnosis includes, but is not limited to, arrhythmia, symptomatic bradycardia, AKI, electrolyte abnormality, hypothyroidism.  Patient nontoxic-appearing and in no acute distress, vital signs remarkable for borderline bradycardia but otherwise reassuring.  He has stable BP and denies any dizziness or lightheadedness currently.  EKG shows sinus bradycardia with PACs, no ischemic changes noted.  Labs thus far without significant anemia, leukocytosis, electrolyte abnormality, or AKI.  We will add on magnesium level and thyroid studies, observe on cardiac monitor.  Magnesium level within normal limits, thyroid studies show elevation in TSH but with normal free T4.  Low suspicion that hypothyroidism is contributing to his bradycardia at this time, metoprolol  seems more likely to be the cause.  Heart rate continues to be around 60 with normal blood pressure and patient is appropriate for discharge home with outpatient cardiology follow-up.  Patient and family advised that he stop taking metoprolol  until he is able to follow-up with cardiology.  He was counseled to return to the ED for new or worsening symptoms, patient and family agree with plan.      FINAL CLINICAL IMPRESSION(S) / ED DIAGNOSES   Final diagnoses:  Bradycardia  Dizziness     Rx / DC Orders   ED Discharge Orders          Ordered    Ambulatory referral to Cardiology        02/08/24 2155             Note:  This document was prepared using Dragon voice recognition software and may include unintentional dictation errors.   Palin Carlin, MD 02/08/24 2156

## 2024-02-08 NOTE — ED Triage Notes (Signed)
 Pt in via [facility] from home w/ complaints of dizziness/light headedness today; patient is unable to give provide onset of symptoms.  Home Physical Therapy is who initiated EMS due to HR readings in 30's; EMS reports HR 40's-60.  Patient denies any other complaints.  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  Hx Parkinsons  Per EMS: 20G PIV Right Wrist LR  CBG 105

## 2024-03-25 ENCOUNTER — Ambulatory Visit: Admitting: Urology

## 2024-04-02 ENCOUNTER — Ambulatory Visit: Admitting: Cardiology

## 2024-04-11 ENCOUNTER — Ambulatory Visit: Admitting: Urology

## 2024-04-27 NOTE — Progress Notes (Deleted)
  Electrophysiology Office Note:   Date:  04/27/2024  ID:  Gregory [name], DOB 28-Feb-1941, MRN [id number]  Primary Cardiologist: None Electrophysiologist: None  {Click to update primary MD,subspecialty MD or APP then REFRESH:1}    History of Present Illness:   Gregory [name] is a 83 y.o. male with h/o hypertension, coronary artery disease s/p PCI stent placement to OM3 04/2001, stroke, hypothyroidism, and Parkinson's disease who is being seen today for evaluation of bradycardia.  7.25.25 Patient had a home PT visit earlier today, where they checked his vital signs and found his heart rate to be in the 30s. Patient reports that he feels dizzy in the morning when he gets up and that this has been going on for greater than 1 year. Family reports that he deals with chronic dizziness and lightheadedness throughout the day, which had been no worse than usual today.  He does take metoprolol  and his cardiologist reduced his dose of metoprolol  in December of last year due to the symptoms.   Discussed the use of AI scribe software for clinical note transcription with the patient, who gave verbal consent to proceed.  History of Present Illness     Review of systems complete and found to be negative unless listed in HPI.   EP Information / Studies Reviewed:    {EKGtoday:28818}     EKG 02/08/24: SR with PACs   Echo 06/2015:  Study Conclusions   - Left ventricle: Systolic function was vigorous. The estimated    ejection fraction was in the range of 65% to 70%.  - Aortic valve: Valve area (Vmax): 3.19 cm^2.       Physical Exam:   VS:  There were no vitals taken for this visit.   Wt Readings from Last 3 Encounters:  02/08/24 140 lb (63.5 kg)  06/23/20 170 lb (77.1 kg)  05/25/20 167 lb (75.8 kg)     GEN: Well nourished, well developed in no acute distress NECK: No JVD CARDIAC: {EPRHYTHM:28826}, no murmurs, rubs, gallops RESPIRATORY:  Clear to auscultation without rales, wheezing or  rhonchi  ABDOMEN: Soft, non-distended EXTREMITIES:  No edema; No deformity   ASSESSMENT AND PLAN:    #?Bradycardia:  #PACs:  #CAD s/p remote PCI  #Hypertension *** goal today.  Recommend checking blood pressures 1-2 times per week at home and recording the values.  Recommend bringing these recordings to the primary care physician Assessment & Plan       Follow up with {EPMDS:28135::EP Team} {EPFOLLOW LE:71826}  Signed, Fonda Kitty, MD

## 2024-04-28 ENCOUNTER — Ambulatory Visit: Attending: Cardiology | Admitting: Cardiology

## 2024-05-07 ENCOUNTER — Institutional Professional Consult (permissible substitution): Admitting: Cardiology

## 2024-05-20 ENCOUNTER — Ambulatory Visit: Admitting: Urology

## 2024-05-20 ENCOUNTER — Encounter: Payer: Self-pay | Admitting: Urology

## 2024-06-11 ENCOUNTER — Ambulatory Visit: Admitting: Urology

## 2024-07-29 ENCOUNTER — Ambulatory Visit: Admitting: Cardiology
